# Patient Record
Sex: Female | Born: 1980 | Race: Black or African American | Hispanic: No | Marital: Single | Smoking: Current every day smoker
Health system: Southern US, Community
[De-identification: ages and names within clinical notes are randomized; demographics above are authoritative.]

## PROBLEM LIST (undated history)

## (undated) DIAGNOSIS — J45909 Unspecified asthma, uncomplicated: Secondary | ICD-10-CM

---

## 2015-06-20 ENCOUNTER — Emergency Department (HOSPITAL_COMMUNITY): Payer: Self-pay

## 2015-06-20 ENCOUNTER — Emergency Department (HOSPITAL_COMMUNITY)
Admission: EM | Admit: 2015-06-20 | Discharge: 2015-06-20 | Disposition: A | Discharge status: Home / Self Care | Payer: Self-pay | Attending: Emergency Medicine | Admitting: Emergency Medicine

## 2015-06-20 ENCOUNTER — Encounter (HOSPITAL_COMMUNITY): Payer: Self-pay | Admitting: *Deleted

## 2015-06-20 DIAGNOSIS — Z880 Allergy status to penicillin: Secondary | ICD-10-CM | POA: Insufficient documentation

## 2015-06-20 DIAGNOSIS — Z792 Long term (current) use of antibiotics: Secondary | ICD-10-CM | POA: Insufficient documentation

## 2015-06-20 DIAGNOSIS — Z720 Tobacco use: Secondary | ICD-10-CM | POA: Insufficient documentation

## 2015-06-20 DIAGNOSIS — J069 Acute upper respiratory infection, unspecified: Secondary | ICD-10-CM | POA: Insufficient documentation

## 2015-06-20 MED ORDER — ALBUTEROL SULFATE (2.5 MG/3ML) 0.083% IN NEBU
5.0000 mg | INHALATION_SOLUTION | Freq: Once | RESPIRATORY_TRACT | Status: AC
  Administered 2015-06-20: 5 mg via RESPIRATORY_TRACT
  Filled 2015-06-20: qty 6

## 2015-06-20 MED ORDER — IPRATROPIUM BROMIDE 0.02 % IN SOLN
0.5000 mg | Freq: Once | RESPIRATORY_TRACT | Status: AC
  Administered 2015-06-20: 0.5 mg via RESPIRATORY_TRACT
  Filled 2015-06-20: qty 2.5

## 2015-06-20 MED ORDER — AZITHROMYCIN 250 MG PO TABS: 250.0000 mg | ORAL_TABLET | Freq: Every day | ORAL | Status: DC

## 2015-06-20 MED ORDER — AZITHROMYCIN 250 MG PO TABS
500.0000 mg | ORAL_TABLET | Freq: Once | ORAL | Status: AC
  Administered 2015-06-20: 500 mg via ORAL
  Filled 2015-06-20: qty 2

## 2015-06-20 MED ORDER — ALBUTEROL SULFATE HFA 108 (90 BASE) MCG/ACT IN AERS
2.0000 | INHALATION_SPRAY | RESPIRATORY_TRACT | Status: DC | PRN
  Administered 2015-06-20: 2 via RESPIRATORY_TRACT
  Filled 2015-06-20: qty 6.7

## 2015-06-20 MED ORDER — GUAIFENESIN 100 MG/5ML PO SOLN
5.0000 mL | Freq: Once | ORAL | Status: AC
  Administered 2015-06-20: 100 mg via ORAL
  Filled 2015-06-20: qty 5

## 2015-06-20 MED ORDER — PREDNISONE 20 MG PO TABS
60.0000 mg | ORAL_TABLET | Freq: Once | ORAL | Status: AC
  Administered 2015-06-20: 60 mg via ORAL
  Filled 2015-06-20: qty 3

## 2015-06-20 MED ORDER — GUAIFENESIN 100 MG/5ML PO LIQD: 100.0000 mg | ORAL | Status: DC | PRN

## 2015-06-20 NOTE — ED Notes (Addendum)
Pt reports cough, congestion, sore throat and SOB starting last night and worsening today. Pt reports yellow mucous with cough.

## 2015-06-20 NOTE — Discharge Instructions (Signed)
Cool Mist Vaporizers °Vaporizers may help relieve the symptoms of a cough and cold. They add moisture to the air, which helps mucus to become thinner and less sticky. This makes it easier to breathe and cough up secretions. Cool mist vaporizers do not cause serious burns like hot mist vaporizers, which may also be called steamers or humidifiers. Vaporizers have not been proven to help with colds. You should not use a vaporizer if you are allergic to mold. °HOME CARE INSTRUCTIONS °· Follow the package instructions for the vaporizer. °· Do not use anything other than distilled water in the vaporizer. °· Do not run the vaporizer all of the time. This can cause mold or bacteria to grow in the vaporizer. °· Clean the vaporizer after each time it is used. °· Clean and dry the vaporizer well before storing it. °· Stop using the vaporizer if worsening respiratory symptoms develop. °Document Released: 06/26/2004 Document Revised: 10/04/2013 Document Reviewed: 02/16/2013 °ExitCare® Patient Information ©2015 [employer]. This information is not intended to replace advice given to you by your health care provider. Make sure you discuss any questions you have with your health care provider. ° °Upper Respiratory Infection, Adult °An upper respiratory infection (URI) is also sometimes known as the common cold. The upper respiratory tract includes the nose, sinuses, throat, trachea, and bronchi. Bronchi are the airways leading to the lungs. Most people improve within 1 week, but symptoms can last up to 2 weeks. A residual cough may last even longer.  °CAUSES °Many different viruses can infect the tissues lining the upper respiratory tract. The tissues become irritated and inflamed and often become very moist. Mucus production is also common. A cold is contagious. You can easily spread the virus to others by oral contact. This includes kissing, sharing a glass, coughing, or sneezing. Touching your mouth or nose and then touching a  surface, which is then touched by another person, can also spread the virus. °SYMPTOMS  °Symptoms typically develop 1 to 3 days after you come in contact with a cold virus. Symptoms vary from person to person. They may include: °· Runny nose. °· Sneezing. °· Nasal congestion. °· Sinus irritation. °· Sore throat. °· Loss of voice (laryngitis). °· Cough. °· Fatigue. °· Muscle aches. °· Loss of appetite. °· Headache. °· Low-grade fever. °DIAGNOSIS  °You might diagnose your own cold based on familiar symptoms, since most people get a cold 2 to 3 times a year. Your caregiver can confirm this based on your exam. Most importantly, your caregiver can check that your symptoms are not due to another disease such as strep throat, sinusitis, pneumonia, asthma, or epiglottitis. Blood tests, throat tests, and X-rays are not necessary to diagnose a common cold, but they may sometimes be helpful in excluding other more serious diseases. Your caregiver will decide if any further tests are required. °RISKS AND COMPLICATIONS  °You may be at risk for a more severe case of the common cold if you smoke cigarettes, have chronic heart disease (such as heart failure) or lung disease (such as asthma), or if you have a weakened immune system. The very young and very old are also at risk for more serious infections. Bacterial sinusitis, middle ear infections, and bacterial pneumonia can complicate the common cold. The common cold can worsen asthma and chronic obstructive pulmonary disease (COPD). Sometimes, these complications can require emergency medical care and may be life-threatening. °PREVENTION  °The best way to protect against getting a cold is to practice good hygiene.   Avoid oral or hand contact with people with cold symptoms. Wash your hands often if contact occurs. There is no clear evidence that vitamin C, vitamin E, echinacea, or exercise reduces the chance of developing a cold. However, it is always recommended to get plenty of  rest and practice good nutrition. °TREATMENT  °Treatment is directed at relieving symptoms. There is no cure. Antibiotics are not effective, because the infection is caused by a virus, not by bacteria. Treatment may include: °· Increased fluid intake. Sports drinks offer valuable electrolytes, sugars, and fluids. °· Breathing heated mist or steam (vaporizer or shower). °· Eating chicken soup or other clear broths, and maintaining good nutrition. °· Getting plenty of rest. °· Using gargles or lozenges for comfort. °· Controlling fevers with ibuprofen or acetaminophen as directed by your caregiver. °· Increasing usage of your inhaler if you have asthma. °Zinc gel and zinc lozenges, taken in the first 24 hours of the common cold, can shorten the duration and lessen the severity of symptoms. Pain medicines may help with fever, muscle aches, and throat pain. A variety of non-prescription medicines are available to treat congestion and runny nose. Your caregiver can make recommendations and may suggest nasal or lung inhalers for other symptoms.  °HOME CARE INSTRUCTIONS  °· Only take over-the-counter or prescription medicines for pain, discomfort, or fever as directed by your caregiver. °· Use a warm mist humidifier or inhale steam from a shower to increase air moisture. This may keep secretions moist and make it easier to breathe. °· Drink enough water and fluids to keep your urine clear or pale yellow. °· Rest as needed. °· Return to work when your temperature has returned to normal or as your caregiver advises. You may need to stay home longer to avoid infecting others. You can also use a face mask and careful hand washing to prevent spread of the virus. °SEEK MEDICAL CARE IF:  °· After the first few days, you feel you are getting worse rather than better. °· You need your caregiver's advice about medicines to control symptoms. °· You develop chills, worsening shortness of breath, or brown or red sputum. These may be  signs of pneumonia. °· You develop yellow or brown nasal discharge or pain in the face, especially when you bend forward. These may be signs of sinusitis. °· You develop a fever, swollen neck glands, pain with swallowing, or white areas in the back of your throat. These may be signs of strep throat. °SEEK IMMEDIATE MEDICAL CARE IF:  °· You have a fever. °· You develop severe or persistent headache, ear pain, sinus pain, or chest pain. °· You develop wheezing, a prolonged cough, cough up blood, or have a change in your usual mucus (if you have chronic lung disease). °· You develop sore muscles or a stiff neck. °Document Released: 03/25/2001 Document Revised: 12/22/2011 Document Reviewed: 01/04/2014 °ExitCare® Patient Information ©2015 [employer]. This information is not intended to replace advice given to you by your health care provider. Make sure you discuss any questions you have with your health care provider. ° °

## 2015-06-20 NOTE — ED Provider Notes (Signed)
CSN: [id number]     Arrival date & time 06/20/15  3016 History  This chart was scribed for Marlon Pel, PA-C, working with Marily Memos, MD by Elon Spanner, ED Scribe. This patient was seen in room TR06C/TR06C and the patient's care was started at 8:00 PM.   Chief Complaint  Patient presents with  . Cough  . Sore Throat   Patient is a 34 y.o. female presenting with pharyngitis. The history is provided by the patient. No language interpreter was used.  Sore Throat   HPI Comments: Mikayla Livingston is a 34 y.o. female, current everyday smoker, with no significant medical hx who presents to the Emergency Department complaining of a moderate sore throat onset 5 days, improved with cough drops.   Associated symptoms include exertional SOB (worse in the morning), cough, congestion, chest tightness, bilateral otalgia, and v/d (one episode each).  She has also tried Theraflu, Mucinex, cough drops, Vitamin C and Albuterol.  She denies abdominal pain, dysuria.    No fevers, headache, neck pain, arthralgias, myalgias, confusion, weakness or rash  History reviewed. No pertinent past medical history.   History reviewed. No pertinent past surgical history. No family history on file. Social History  Substance Use Topics  . Smoking status: Current Every Day Smoker -- 0.00 packs/day  . Smokeless tobacco: None  . Alcohol Use: None   OB History    No data available     Review of Systems A complete 10 system review of systems was obtained and all systems are negative except as noted in the HPI and PMH.   Allergies  Penicillins  Home Medications   Prior to Admission medications   Medication Sig Start Date End Date Taking? Authorizing Provider  azithromycin (ZITHROMAX) 250 MG tablet Take 1 tablet (250 mg total) by mouth daily. 1 tab daily til finished. 06/21/15   Muriah Harsha Neva Seat, PA-C  guaiFENesin (ROBITUSSIN) 100 MG/5ML liquid Take 5-10 mLs (100-200 mg total) by mouth every 4 (four) hours as needed for  cough. 06/20/15   Teng Decou Neva Seat, PA-C   BP 115/71 mmHg  Pulse 70  Temp(Src) 98.6 F (37 C) (Oral)  Resp 22  Ht 5\' 5"  (1.651 m)  Wt 115 lb (52.164 kg)  BMI 19.14 kg/m2  SpO2 98%  LMP 06/05/2015 Physical Exam  Constitutional: She is oriented to person, place, and time. She appears well-developed and well-nourished. No distress.  HENT:  Head: Normocephalic and atraumatic.  Right Ear: Tympanic membrane and ear canal normal.  Left Ear: Tympanic membrane and ear canal normal.  Nose: Nose normal. Right sinus exhibits no maxillary sinus tenderness and no frontal sinus tenderness. Left sinus exhibits no maxillary sinus tenderness and no frontal sinus tenderness.  Mouth/Throat: Uvula is midline and oropharynx is clear and moist.  Eyes: Conjunctivae and EOM are normal.  Neck: Neck supple. No tracheal deviation present.  Cardiovascular: Normal rate.   Pulmonary/Chest: Effort normal. No respiratory distress. She has wheezes. She has rhonchi in the left upper field.  Abdominal: Bowel sounds are normal. There is no tenderness. There is no rigidity, no rebound and no guarding.  Musculoskeletal: Normal range of motion.  Neurological: She is alert and oriented to person, place, and time.  Skin: Skin is warm and dry. No rash noted.  Psychiatric: She has a normal mood and affect. Her behavior is normal.  Nursing note and vitals reviewed.   ED Course  Procedures (including critical care time)  DIAGNOSTIC STUDIES: Oxygen Saturation is 98% on RA, normal by my  interpretation.    COORDINATION OF CARE:  8:07 PM Will order breathing treatment and prescribe cough medication and antibiotic.  Patient acknowledges and agrees with plan.    Labs Review Labs Reviewed - No data to display  Imaging Review Dg Chest 2 View  06/20/2015   CLINICAL DATA:  Cough and shortness of breath for 3 days.  EXAM: CHEST  2 VIEW  COMPARISON:  None.  FINDINGS: Normal cardiac and mediastinal contours. No consolidative  pulmonary opacities. No pleural effusion or pneumothorax. Scoliotic curvature of the thoracolumbar spine.  IMPRESSION: No acute cardiopulmonary process.   Electronically Signed   By: Annia Belt M.D.   On: 06/20/2015 19:53   I have personally reviewed and evaluated these images and lab results as part of my medical decision-making.   EKG Interpretation None      MDM   Final diagnoses:  URI (upper respiratory infection)    Medications  guaiFENesin (ROBITUSSIN) 100 MG/5ML solution 100 mg (not administered)  albuterol (PROVENTIL HFA;VENTOLIN HFA) 108 (90 BASE) MCG/ACT inhaler 2 puff (not administered)  albuterol (PROVENTIL) (2.5 MG/3ML) 0.083% nebulizer solution 5 mg (5 mg Nebulization Given 06/20/15 2016)  ipratropium (ATROVENT) nebulizer solution 0.5 mg (0.5 mg Nebulization Given 06/20/15 2016)  azithromycin (ZITHROMAX) tablet 500 mg (500 mg Oral Given 06/20/15 2015)  predniSONE (DELTASONE) tablet 60 mg (60 mg Oral Given 06/20/15 2015)   Patient has wheezing with rhonchi. Her chest xray is negative for pneumonia or any acute cardiopulmonary disease. Oxygen saturation is 98 % and pt speaks in full sentences. Afebrile non toxic appearing.  RX:  azithromycin (ZITHROMAX) 250 MG tablet Take 1 tablet (250 mg total) by mouth daily. 1 tab daily til finished. 4 tablet Marlon Pel, PA-C    guaiFENesin (ROBITUSSIN) 100 MG/5ML liquid Take 5-10 mLs (100-200 mg total) by mouth every 4 (four) hours as needed for cough. 60 mL Marlon Pel, PA-C    34 y.o.Mikayla Livingston's evaluation in the Emergency Department is complete. It has been determined that no acute conditions requiring further emergency intervention are present at this time. The patient/guardian have been advised of the diagnosis and plan. We have discussed signs and symptoms that warrant return to the ED, such as changes or worsening in symptoms.  Vital signs are stable at discharge. Filed Vitals:   06/20/15 1901  BP: 115/71  Pulse: 70   Temp: 98.6 F (37 C)  Resp: 22    Patient/guardian has voiced understanding and agreed to follow-up with the PCP or specialist.   I personally performed the services described in this documentation, which was scribed in my presence. The recorded information has been reviewed and is accurate.    Marlon Pel, PA-C 06/20/15 2019  Marily Memos, MD 06/21/15 [phone]

## 2015-06-22 ENCOUNTER — Other Ambulatory Visit: Payer: Self-pay

## 2015-11-05 ENCOUNTER — Emergency Department (HOSPITAL_COMMUNITY)
Admission: EM | Admit: 2015-11-05 | Discharge: 2015-11-05 | Disposition: A | Discharge status: Home / Self Care | Payer: Self-pay | Attending: Emergency Medicine | Admitting: Emergency Medicine

## 2015-11-05 ENCOUNTER — Emergency Department (HOSPITAL_COMMUNITY): Payer: Self-pay

## 2015-11-05 ENCOUNTER — Encounter (HOSPITAL_COMMUNITY): Payer: Self-pay

## 2015-11-05 DIAGNOSIS — F17200 Nicotine dependence, unspecified, uncomplicated: Secondary | ICD-10-CM | POA: Insufficient documentation

## 2015-11-05 DIAGNOSIS — R079 Chest pain, unspecified: Secondary | ICD-10-CM | POA: Insufficient documentation

## 2015-11-05 DIAGNOSIS — J069 Acute upper respiratory infection, unspecified: Secondary | ICD-10-CM | POA: Insufficient documentation

## 2015-11-05 DIAGNOSIS — J40 Bronchitis, not specified as acute or chronic: Secondary | ICD-10-CM | POA: Insufficient documentation

## 2015-11-05 DIAGNOSIS — Z880 Allergy status to penicillin: Secondary | ICD-10-CM | POA: Insufficient documentation

## 2015-11-05 MED ORDER — ALBUTEROL SULFATE HFA 108 (90 BASE) MCG/ACT IN AERS
2.0000 | INHALATION_SPRAY | Freq: Once | RESPIRATORY_TRACT | Status: AC
  Administered 2015-11-05: 2 via RESPIRATORY_TRACT
  Filled 2015-11-05: qty 6.7

## 2015-11-05 MED ORDER — PREDNISONE 20 MG PO TABS: 40.0000 mg | ORAL_TABLET | Freq: Every day | ORAL | Status: DC

## 2015-11-05 MED ORDER — PREDNISONE 20 MG PO TABS
60.0000 mg | ORAL_TABLET | Freq: Once | ORAL | Status: AC
  Administered 2015-11-05: 60 mg via ORAL
  Filled 2015-11-05: qty 3

## 2015-11-05 MED ORDER — GUAIFENESIN-DM 100-10 MG/5ML PO SYRP: 5.0000 mL | ORAL_SOLUTION | ORAL | Status: DC | PRN

## 2015-11-05 MED ORDER — IPRATROPIUM-ALBUTEROL 0.5-2.5 (3) MG/3ML IN SOLN
3.0000 mL | Freq: Once | RESPIRATORY_TRACT | Status: AC
  Administered 2015-11-05: 3 mL via RESPIRATORY_TRACT
  Filled 2015-11-05: qty 3

## 2015-11-05 MED ORDER — AEROCHAMBER Z-STAT PLUS/MEDIUM MISC
1.0000 | Freq: Once | Status: AC
  Administered 2015-11-05: 1
  Filled 2015-11-05 (×2): qty 1

## 2015-11-05 NOTE — ED Notes (Signed)
Patient transported to X-ray 

## 2015-11-05 NOTE — ED Provider Notes (Signed)
CSN: [id number]     Arrival date & time 11/05/15  0906 History   First MD Initiated Contact with Patient 11/05/15 1008     Chief Complaint  Patient presents with  . Cough  . Shortness of Breath     (Consider location/radiation/quality/duration/timing/severity/associated sxs/prior Treatment) HPI Mikayla Livingston is a 35 y.o. female with no medical problems presents to emergency department complaining of cough and flulike symptoms. Patient states that her symptoms began 4 days ago. States chest pain began 3 days ago. Pain is worse with coughing and deep breathing. She reports associated shortness of breath. She states cough is productive, unknown color of the sputum. She reports associated body aches, chills, sweats, nasal congestion, sore throat. She denies any nausea, vomiting, diarrhea. No neck pain or stiffness. Denies urinary symptoms. She has not taken anything for her symptoms at home because she did not know what to take. No other complaints. Did not get her flu shot this year.  History reviewed. No pertinent past medical history. History reviewed. No pertinent past surgical history. No family history on file. Social History  Substance Use Topics  . Smoking status: Current Every Day Smoker -- 0.00 packs/day  . Smokeless tobacco: None  . Alcohol Use: None   OB History    No data available     Review of Systems  Constitutional: Positive for chills and fatigue. Negative for fever.  Respiratory: Positive for cough, chest tightness and shortness of breath.   Cardiovascular: Positive for chest pain. Negative for palpitations and leg swelling.  Gastrointestinal: Negative for nausea, vomiting, abdominal pain and diarrhea.  Genitourinary: Negative for dysuria, flank pain and pelvic pain.  Musculoskeletal: Negative for myalgias, arthralgias, neck pain and neck stiffness.  Skin: Negative for rash.  Neurological: Positive for weakness. Negative for dizziness and headaches.  All other systems  reviewed and are negative.     Allergies  Penicillins  Home Medications   Prior to Admission medications   Medication Sig Start Date End Date Taking? Authorizing Provider  azithromycin (ZITHROMAX) 250 MG tablet Take 1 tablet (250 mg total) by mouth daily. 1 tab daily til finished. Patient not taking: Reported on 11/05/2015 06/21/15   Marlon Pel, PA-C  guaiFENesin (ROBITUSSIN) 100 MG/5ML liquid Take 5-10 mLs (100-200 mg total) by mouth every 4 (four) hours as needed for cough. Patient not taking: Reported on 11/05/2015 06/20/15   Marlon Pel, PA-C   BP 114/83 mmHg  Pulse 67  Temp(Src) 98 F (36.7 C) (Oral)  Resp 25  Ht  (1.651 m)  Wt 50.576 kg  BMI 18.55 kg/m2  SpO2 95%  LMP 10/22/2015 Physical Exam  Constitutional: She is oriented to person, place, and time. She appears well-developed and well-nourished. No distress.  HENT:  Head: Normocephalic and atraumatic.  Right Ear: Tympanic membrane, external ear and ear canal normal.  Left Ear: Tympanic membrane, external ear and ear canal normal.  Nose: Mucosal edema and rhinorrhea present.  Mouth/Throat: Uvula is midline, oropharynx is clear and moist and mucous membranes are normal.  Eyes: Conjunctivae are normal. Pupils are equal, round, and reactive to light.  Neck: Neck supple.  Cardiovascular: Normal rate, regular rhythm and normal heart sounds.   Pulmonary/Chest: Effort normal. No respiratory distress. She has wheezes. She has no rales.  Inspiratory and expiratory wheezes bilaterally  Abdominal: Soft. Bowel sounds are normal. She exhibits no distension. There is no tenderness. There is no rebound.  Musculoskeletal: She exhibits no edema.  Neurological: She is alert and oriented  to person, place, and time. No cranial nerve deficit. Coordination normal.  Skin: Skin is warm and dry.  Psychiatric: She has a normal mood and affect. Her behavior is normal.  Nursing note and vitals reviewed.   ED Course  Procedures  (including critical care time) Labs Review Labs Reviewed - No data to display  Imaging Review Dg Chest 2 View  11/05/2015  CLINICAL DATA:  Right chest pain, shortness of breath for few days. Smoker. EXAM: CHEST  2 VIEW COMPARISON:  06/20/2015 FINDINGS: The heart size and mediastinal contours are within normal limits. Both lungs are clear. The visualized skeletal structures are unremarkable. IMPRESSION: No active cardiopulmonary disease. Electronically Signed   By: Charlett Nose M.D.   On: 11/05/2015 09:35   I have personally reviewed and evaluated these images and lab results as part of my medical decision-making.   EKG Interpretation None      MDM   Final diagnoses:  Bronchitis  URI (upper respiratory infection)    patient with URI symptoms. Here normal vital signs including normal temperature, blood pressure, heart rate. Normal oxygen saturation. Lungs are tight, with decreased air movement bilaterally, inspiratory and expiratory wheezes. Chest x-ray obtained and is negative. Will try breathing treatments started prednisone.   11:23 AM Pt feeling much better after a breathing treatment. Will dc home with inhaler, prednisone course, follow up with pcp.  Will also give prescription for prednisone burst. Pt's vS normal, not hypoxic, not tachycardic, doubt PE. No respiratory distress.   Filed Vitals:   11/05/15 1045 11/05/15 1100 11/05/15 1115 11/05/15 1130  BP: 115/81 124/75 109/74 107/74  Pulse: 74 70 68 72  Temp:      TempSrc:      Resp: Height:      Weight:      SpO2: 97% 96% 98% 96%     Jaynie Crumble, PA-C 11/05/15 2001  Margarita Grizzle, MD 11/07/15 1256

## 2015-11-05 NOTE — ED Notes (Signed)
Patient here with cough and congestion since Thursday, reports that she feels as if she cant catch her breath because it hurts to breath, no distress

## 2015-11-05 NOTE — Discharge Instructions (Signed)
Take prednisone as prescribed until all gone. Inhaler 2 puffs every 4 hrs. Robitussin as needed for cough. Follow up with primary care doctor.   Bronchospasm, Adult A bronchospasm is when the tubes that carry air in and out of your lungs (airways) spasm or tighten. During a bronchospasm it is hard to breathe. This is because the airways get smaller. A bronchospasm can be triggered by:  Allergies. These may be to animals, pollen, food, or mold.  Infection. This is a common cause of bronchospasm.  Exercise.  Irritants. These include pollution, cigarette smoke, strong odors, aerosol sprays, and paint fumes.  Weather changes.  Stress.  Being emotional. HOME CARE   Always have a plan for getting help. Know when to call your doctor and local emergency services (911 in the [place] Know where you can get emergency care.  Only take medicines as told by your doctor.  If you were prescribed an inhaler or nebulizer machine, ask your doctor how to use it correctly. Always use a spacer with your inhaler if you were given one.  Stay calm during an attack. Try to relax and breathe more slowly.  Control your home environment:  Change your heating and air conditioning filter at least once a month.  Limit your use of fireplaces and wood stoves.  Do not  smoke. Do not  allow smoking in your home.  Avoid perfumes and fragrances.  Get rid of pests (such as roaches and mice) and their droppings.  Throw away plants if you see mold on them.  Keep your house clean and dust free.  Replace carpet with wood, tile, or vinyl flooring. Carpet can trap dander and dust.  Use allergy-proof pillows, mattress covers, and box spring covers.  Wash bed sheets and blankets every week in hot water. Dry them in a dryer.  Use blankets that are made of polyester or cotton.  Wash hands frequently. GET HELP IF:  You have muscle aches.  You have chest pain.  The thick spit you spit or cough up (sputum)  changes from clear or white to yellow, green, gray, or bloody.  The thick spit you spit or cough up gets thicker.  There are problems that may be related to the medicine you are given such as:  A rash.  Itching.  Swelling.  Trouble breathing. GET HELP RIGHT AWAY IF:  You feel you cannot breathe or catch your breath.  You cannot stop coughing.  Your treatment is not helping you breathe better.  You have very bad chest pain. MAKE SURE YOU:   Understand these instructions.  Will watch your condition.  Will get help right away if you are not doing well or get worse.   This information is not intended to replace advice given to you by your health care provider. Make sure you discuss any questions you have with your health care provider.   Document Released: 07/27/2009 Document Revised: 10/20/2014 Document Reviewed: 03/22/2013 Elsevier Interactive Patient Education 2016 [employer]  Upper Respiratory Infection, Adult Most upper respiratory infections (URIs) are a viral infection of the air passages leading to the lungs. A URI affects the nose, throat, and upper air passages. The most common type of URI is nasopharyngitis and is typically referred to as "the common cold." URIs run their course and usually go away on their own. Most of the time, a URI does not require medical attention, but sometimes a bacterial infection in the upper airways can follow a viral infection. This is called  a secondary infection. Sinus and middle ear infections are common types of secondary upper respiratory infections. Bacterial pneumonia can also complicate a URI. A URI can worsen asthma and chronic obstructive pulmonary disease (COPD). Sometimes, these complications can require emergency medical care and may be life threatening.  CAUSES Almost all URIs are caused by viruses. A virus is a type of germ and can spread from one person to another.  RISKS FACTORS You may be at risk for a URI if:   You  smoke.   You have chronic heart or lung disease.  You have a weakened defense (immune) system.   You are very young or very old.   You have nasal allergies or asthma.  You work in crowded or poorly ventilated areas.  You work in health care facilities or schools. SIGNS AND SYMPTOMS  Symptoms typically develop 2-3 days after you come in contact with a cold virus. Most viral URIs last 7-10 days. However, viral URIs from the influenza virus (flu virus) can last 14-18 days and are typically more severe. Symptoms may include:   Runny or stuffy (congested) nose.   Sneezing.   Cough.   Sore throat.   Headache.   Fatigue.   Fever.   Loss of appetite.   Pain in your forehead, behind your eyes, and over your cheekbones (sinus pain).  Muscle aches.  DIAGNOSIS  Your health care provider may diagnose a URI by:  Physical exam.  Tests to check that your symptoms are not due to another condition such as:  Strep throat.  Sinusitis.  Pneumonia.  Asthma. TREATMENT  A URI goes away on its own with time. It cannot be cured with medicines, but medicines may be prescribed or recommended to relieve symptoms. Medicines may help:  Reduce your fever.  Reduce your cough.  Relieve nasal congestion. HOME CARE INSTRUCTIONS   Take medicines only as directed by your health care provider.   Gargle warm saltwater or take cough drops to comfort your throat as directed by your health care provider.  Use a warm mist humidifier or inhale steam from a shower to increase air moisture. This may make it easier to breathe.  Drink enough fluid to keep your urine clear or pale yellow.   Eat soups and other clear broths and maintain good nutrition.   Rest as needed.   Return to work when your temperature has returned to normal or as your health care provider advises. You may need to stay home longer to avoid infecting others. You can also use a face mask and careful hand  washing to prevent spread of the virus.  Increase the usage of your inhaler if you have asthma.   Do not use any tobacco products, including cigarettes, chewing tobacco, or electronic cigarettes. If you need help quitting, ask your health care provider. PREVENTION  The best way to protect yourself from getting a cold is to practice good hygiene.   Avoid oral or hand contact with people with cold symptoms.   Wash your hands often if contact occurs.  There is no clear evidence that vitamin C, vitamin E, echinacea, or exercise reduces the chance of developing a cold. However, it is always recommended to get plenty of rest, exercise, and practice good nutrition.  SEEK MEDICAL CARE IF:   You are getting worse rather than better.   Your symptoms are not controlled by medicine.   You have chills.  You have worsening shortness of breath.  You have brown or  red mucus.  You have yellow or brown nasal discharge.  You have pain in your face, especially when you bend forward.  You have a fever.  You have swollen neck glands.  You have pain while swallowing.  You have white areas in the back of your throat. SEEK IMMEDIATE MEDICAL CARE IF:   You have severe or persistent:  Headache.  Ear pain.  Sinus pain.  Chest pain.  You have chronic lung disease and any of the following:  Wheezing.  Prolonged cough.  Coughing up blood.  A change in your usual mucus.  You have a stiff neck.  You have changes in your:  Vision.  Hearing.  Thinking.  Mood. MAKE SURE YOU:   Understand these instructions.  Will watch your condition.  Will get help right away if you are not doing well or get worse.   This information is not intended to replace advice given to you by your health care provider. Make sure you discuss any questions you have with your health care provider.   Document Released: 03/25/2001 Document Revised: 02/13/2015 Document Reviewed: 01/04/2014 Elsevier  Interactive Patient Education [employer]

## 2016-07-20 ENCOUNTER — Emergency Department (HOSPITAL_COMMUNITY): Payer: Medicaid Other

## 2016-07-20 ENCOUNTER — Encounter (HOSPITAL_COMMUNITY): Payer: Self-pay

## 2016-07-20 ENCOUNTER — Emergency Department (HOSPITAL_COMMUNITY)
Admission: EM | Admit: 2016-07-20 | Discharge: 2016-07-20 | Disposition: A | Discharge status: Home / Self Care | Payer: Medicaid Other | Attending: Emergency Medicine | Admitting: Emergency Medicine

## 2016-07-20 DIAGNOSIS — R0602 Shortness of breath: Secondary | ICD-10-CM | POA: Diagnosis present

## 2016-07-20 DIAGNOSIS — F17200 Nicotine dependence, unspecified, uncomplicated: Secondary | ICD-10-CM | POA: Insufficient documentation

## 2016-07-20 DIAGNOSIS — J9801 Acute bronchospasm: Secondary | ICD-10-CM | POA: Insufficient documentation

## 2016-07-20 HISTORY — DX: Unspecified asthma, uncomplicated: J45.909

## 2016-07-20 MED ORDER — ALBUTEROL SULFATE HFA 108 (90 BASE) MCG/ACT IN AERS: 1.0000 | INHALATION_SPRAY | Freq: Four times a day (QID) | RESPIRATORY_TRACT | 0 refills | Status: DC | PRN

## 2016-07-20 MED ORDER — ALBUTEROL SULFATE (2.5 MG/3ML) 0.083% IN NEBU
5.0000 mg | INHALATION_SOLUTION | Freq: Once | RESPIRATORY_TRACT | Status: AC
  Administered 2016-07-20: 5 mg via RESPIRATORY_TRACT
  Filled 2016-07-20: qty 6

## 2016-07-20 NOTE — ED Notes (Signed)
Patient transported to X-ray 

## 2016-07-20 NOTE — ED Provider Notes (Signed)
[facility]EMERGENCY DEPT Provider Note   CSN: [id number] Arrival date & time: 07/20/16  0435     History   Chief Complaint Chief Complaint  Patient presents with  . Shortness of Breath    HPI Mikayla Livingston is a 35 y.o. female.    reports a history of asthma comes in for evaluation of acute shortness of breath. Patient reports she woke up at 3:00 AM with spontaneous shortness of breath. She feels like this is typical bronchospasm for her. Reports symptoms resolved upon arrival in ED. Reports she did not have an albuterol inhaler at home. Denies any overt chest pain, fevers or chills, leg swelling, recent travel or surgeries, OCP or hormone use. Denies any discomfort now whatsoever. Would like referral to primary care. No other modifying factors  Past Medical History:  Diagnosis Date  . Asthma     There are no active problems to display for this patient.   History reviewed. No pertinent surgical history.  OB History    No data available       Home Medications    Prior to Admission medications   Medication Sig Start Date End Date Taking? Authorizing Provider  albuterol (PROVENTIL HFA;VENTOLIN HFA) 108 (90 Base) MCG/ACT inhaler Inhale 1-2 puffs into the lungs every 6 (six) hours as needed for wheezing or shortness of breath. 07/20/16   Joycie Peek, PA-C  azithromycin (ZITHROMAX) 250 MG tablet Take 1 tablet (250 mg total) by mouth daily. 1 tab daily til finished. Patient not taking: Reported on 07/20/2016 06/21/15   Marlon Pel, PA-C  guaiFENesin (ROBITUSSIN) 100 MG/5ML liquid Take 5-10 mLs (100-200 mg total) by mouth every 4 (four) hours as needed for cough. Patient not taking: Reported on 07/20/2016 06/20/15   Marlon Pel, PA-C  guaiFENesin-dextromethorphan (ROBITUSSIN DM) 100-10 MG/5ML syrup Take 5 mLs by mouth every 4 (four) hours as needed for cough. Patient not taking: Reported on 07/20/2016 11/05/15   Tatyana Kirichenko, PA-C  predniSONE (DELTASONE) 20 MG tablet Take 2  tablets (40 mg total) by mouth daily. Patient not taking: Reported on 07/20/2016 11/05/15   Jaynie Crumble, PA-C    Family History No family history on file.  Social History Social History  Substance Use Topics  . Smoking status: Current Every Day Smoker    Packs/day: 0.00  . Smokeless tobacco: Not on file  . Alcohol use No     Allergies   Penicillins   Review of Systems Review of Systems A 10 point review of systems was completed and was negative except for pertinent positives and negatives as mentioned in the history of present illness    Physical Exam Updated Vital Signs BP 109/57   Pulse 79   Temp 97.9 F (36.6 C) (Oral)   Resp 19   Ht 5\' 5"  (1.651 m)   Wt 54.4 kg   LMP 07/07/2016 (Within Days)   SpO2 97%   BMI 19.97 kg/m   Physical Exam  Constitutional: She appears well-developed. No distress.  Awake, alert and nontoxic in appearance  HENT:  Head: Normocephalic and atraumatic.  Right Ear: External ear normal.  Left Ear: External ear normal.  Mouth/Throat: Oropharynx is clear and moist.  Eyes: Conjunctivae and EOM are normal. Pupils are equal, round, and reactive to light.  Neck: Normal range of motion. No JVD present.  Cardiovascular: Normal rate, regular rhythm and normal heart sounds.   Pulmonary/Chest: Effort normal and breath sounds normal. No stridor.  Abdominal: Soft. There is no tenderness.  Musculoskeletal: Normal range  of motion. She exhibits no edema.  Neurological:  Awake, alert, cooperative and aware of situation; motor strength bilaterally; sensation normal to light touch bilaterally; no facial asymmetry; tongue midline; major cranial nerves appear intact;  baseline gait without new ataxia.  Skin: No rash noted. She is not diaphoretic.  Psychiatric: She has a normal mood and affect. Her behavior is normal. Thought content normal.  Nursing note and vitals reviewed.     ED Treatments / Results  Labs (all labs ordered are listed, but  only abnormal results are displayed) Labs Reviewed - No data to display  EKG  EKG Interpretation  Date/Time:  Sunday July 20 2016 05:26:53 EDT Ventricular Rate:  63 PR Interval:    QRS Duration: 82 QT Interval:  446 QTC Calculation: 457 R Axis:   76 Text Interpretation:  Sinus rhythm Confirmed by [facility]AL[facility]RAS[facility]  MD, APRIL ([id number]) on 07/20/2016 5:37:02 AM       Radiology Dg Chest 2 View  Result Date: 07/20/2016 CLINICAL DATA:  Shortness of breath for 3 days.  Coughing EXAM: CHEST  2 VIEW COMPARISON:  Chest radiograph 11/05/2015 FINDINGS: Cardiomediastinal contours are normal. No pneumothorax or pleural effusion. No focal airspace consolidation or pulmonary edema. IMPRESSION: Clear lungs. Electronically Signed   By: Deatra RobinsonKevin  Herman M.D.   On: 07/20/2016 06:22    Procedures Procedures (including critical care time)  Medications Ordered in ED Medications  albuterol (PROVENTIL) (2.5 MG/3ML) 0.083% nebulizer solution 5 mg (5 mg Nebulization Given 07/20/16 0534)   <ECG>   Initial Impression / Assessment and Plan / ED Course  I have reviewed the triage vital signs and the nursing notes.  Pertinent labs & imaging results that were available during my care of the patient were reviewed by me and considered in my medical decision making (see chart for details).  Clinical Course    Patient with likely acute bronchospasm that resolved spontaneously. Rx for albuterol inhaler as well as referral to primary care. Low suspicion for other emergent pathology. She is PERC negative. Discussed follow-up as well as strict return precautions. She understands to return to emergency department if symptoms resume or worsen.  Final Clinical Impressions(s) / ED Diagnoses   Final diagnoses:  Bronchospasm    New Prescriptions New Prescriptions   ALBUTEROL (PROVENTIL HFA;VENTOLIN HFA) 108 (90 BASE) MCG/ACT INHALER    Inhale 1-2 puffs into the lungs every 6 (six) hours as needed for wheezing or  shortness of breath.     Joycie PeekBenjamin Desma Wilkowski, PA-C 07/20/16 1401    April Palumbo, MD 07/24/16 [phone]

## 2016-07-20 NOTE — Discharge Instructions (Signed)
Use your albuterol inhaler as we discussed. Follow up with [facility] for definitive primary care. Return to ED for any new or worsening symptoms as we discussed.

## 2016-07-20 NOTE — ED Triage Notes (Signed)
Pt states she started having coughing with yellow sputum x 3 days; pt states she woke up this morning with SOB; Pt c/o HA x 3 days but denies pain on at triage; Pt a&ox 4 on arrival. No acute distress noted;

## 2017-07-03 ENCOUNTER — Emergency Department (HOSPITAL_COMMUNITY)
Admission: EM | Admit: 2017-07-03 | Discharge: 2017-07-03 | Disposition: A | Discharge status: Home / Self Care | Payer: Self-pay | Attending: Emergency Medicine | Admitting: Emergency Medicine

## 2017-07-03 ENCOUNTER — Emergency Department (HOSPITAL_COMMUNITY): Payer: Self-pay

## 2017-07-03 ENCOUNTER — Encounter (HOSPITAL_COMMUNITY): Payer: Self-pay | Admitting: *Deleted

## 2017-07-03 DIAGNOSIS — J40 Bronchitis, not specified as acute or chronic: Secondary | ICD-10-CM | POA: Insufficient documentation

## 2017-07-03 DIAGNOSIS — F17200 Nicotine dependence, unspecified, uncomplicated: Secondary | ICD-10-CM | POA: Insufficient documentation

## 2017-07-03 DIAGNOSIS — R070 Pain in throat: Secondary | ICD-10-CM | POA: Insufficient documentation

## 2017-07-03 MED ORDER — ALBUTEROL SULFATE HFA 108 (90 BASE) MCG/ACT IN AERS: 1.0000 | INHALATION_SPRAY | Freq: Four times a day (QID) | RESPIRATORY_TRACT | 0 refills | Status: DC | PRN

## 2017-07-03 MED ORDER — AZITHROMYCIN 250 MG PO TABS: ORAL_TABLET | ORAL | 0 refills | Status: DC

## 2017-07-03 NOTE — ED Provider Notes (Signed)
[facility] Provider Note   CSN: [id number] Arrival date & time: 07/03/17  0856     History   Chief Complaint Chief Complaint  Patient presents with  . Nasal Congestion  . Sore Throat  . Dizziness    HPI Mikayla Livingston is a 36 y.o. female history of asthma here presenting with chest tightness, cough. 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 For the last 2-3 days, she has been having nonproductive cough, sore throat, left ear pain. Also felt dizzy yesterday but didn't pass out. Had subjective chills and fever 2 days that resolved. Denies abdominal pain or vomiting or urinary symptoms. Denies being pregnant.   The history is provided by the patient.    Past Medical History:  Diagnosis Date  . Asthma     There are no active problems to display for this patient.   History reviewed. No pertinent surgical history.  OB History    No data available       Home Medications    Prior to Admission medications   Medication Sig Start Date End Date Taking? Authorizing Provider  albuterol (PROVENTIL HFA;VENTOLIN HFA) 108 (90 Base) MCG/ACT inhaler Inhale 1-2 puffs into the lungs every 6 (six) hours as needed for wheezing or shortness of breath. 07/03/17   Charlynne Pander, MD  azithromycin (ZITHROMAX Z-PAK) 250 MG tablet 2 po day one, then 1 daily x 4 days 07/03/17   Charlynne Pander, MD  guaiFENesin (ROBITUSSIN) 100 MG/5ML liquid Take 5-10 mLs (100-200 mg total) by mouth every 4 (four) hours as needed for cough. Patient not taking: Reported on 07/20/2016 06/20/15   Marlon Pel, PA-C  guaiFENesin-dextromethorphan (ROBITUSSIN DM) 100-10 MG/5ML syrup Take 5 mLs by mouth every 4 (four) hours as needed for cough. Patient not taking: Reported on 07/20/2016 11/05/15   Jaynie Crumble, PA-C  predniSONE (DELTASONE) 20 MG tablet Take 2 tablets (40 mg total) by mouth daily. Patient not taking: Reported on  07/20/2016 11/05/15   Jaynie Crumble, PA-C    Family History No family history on file.  Social History Social History  Substance Use Topics  . Smoking status: Current Every Day Smoker    Packs/day: 0.00  . Smokeless tobacco: Never Used  . Alcohol use No     Allergies   Penicillins   Review of Systems Review of Systems  Constitutional: Positive for chills.  Respiratory: Positive for cough.   Neurological: Positive for dizziness.  All other systems reviewed and are negative.    Physical Exam Updated Vital Signs BP (!) 102/59 (BP Location: Right Arm)   Pulse 61   Temp 98.7 F (37.1 C) (Oral)   Resp (!) 22   Ht  (1.651 m)   LMP 06/19/2017 (Approximate)   SpO2 100%   Physical Exam  Constitutional: She is oriented to person, place, and time. She appears well-developed.  HENT:  Head: Normocephalic.  Right Ear: External ear normal.  Left Ear: External ear normal.  Mouth/Throat: Oropharynx is clear and moist.  TM nl bilaterally, posterior pharynx not red, no tonsillar exudates   Eyes: Pupils are equal, round, and reactive to light. Conjunctivae and EOM are normal.  Neck: Normal range of motion. Neck supple.  Cardiovascular: Normal rate, regular rhythm and normal heart sounds.   Pulmonary/Chest: Effort normal.  Minimal wheezing throughout, no retractions, slightly tachypneic   Abdominal: Soft. Bowel sounds are normal. She exhibits no distension. There  is no tenderness. There is no guarding.  Musculoskeletal: Normal range of motion. She exhibits no deformity (.id).  Neurological: She is alert and oriented to person, place, and time.  Skin: Skin is warm.  Psychiatric: She has a normal mood and affect.  Nursing note and vitals reviewed.    ED Treatments / Results  Labs (all labs ordered are listed, but only abnormal results are displayed) Labs Reviewed - No data to display  EKG  EKG Interpretation None       Radiology Dg Chest 2 View  Result  Date: 07/03/2017 CLINICAL DATA:  Headache and fever.  Chest pain. EXAM: CHEST  2 VIEW COMPARISON:  July 20, 2016 FINDINGS: The heart size and mediastinal contours are within normal limits. Both lungs are clear. The visualized skeletal structures are unremarkable. IMPRESSION: No active cardiopulmonary disease. Electronically Signed   By: Gerome Sam III M.D   On: 07/03/2017 09:59    Procedures Procedures (including critical care time)  Medications Ordered in ED Medications - No data to display   Initial Impression / Assessment and Plan / ED Course  I have reviewed the triage vital signs and the nursing notes.  Pertinent labs & imaging results that were available during my care of the patient were reviewed by me and considered in my medical decision making (see chart for details).    Mikayla Livingston is a 36 y.o. female here with cough, wheezing. Afebrile, not hypoxia. Well appearing. Likely mild bronchitis. CXR showed no pneumonia. Will dc home with zpack, albuterol prn.    Final Clinical Impressions(s) / ED Diagnoses   Final diagnoses:  Bronchitis    New Prescriptions New Prescriptions   AZITHROMYCIN (ZITHROMAX Z-PAK) 250 MG TABLET    2 po day one, then 1 daily x 4 days     Charlynne Pander, MD 07/03/17 1222

## 2017-07-03 NOTE — Discharge Instructions (Signed)
Take tylenol, motrin for fever or chills   Use albuterol every 4-6 hrs as needed for cough.   Take zpack as prescribed.   See your doctor  Return to ER if you have fever, trouble breathing, worse sore throat or ear pain

## 2017-07-03 NOTE — ED Triage Notes (Signed)
Pt reports chest tightness and cough

## 2017-07-03 NOTE — ED Triage Notes (Signed)
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 Pt states dizziness happened twice and last time last night.

## 2018-04-11 ENCOUNTER — Emergency Department (HOSPITAL_COMMUNITY)
Admission: EM | Admit: 2018-04-11 | Discharge: 2018-04-11 | Disposition: A | Discharge status: Home / Self Care | Payer: Self-pay | Attending: Emergency Medicine | Admitting: Emergency Medicine

## 2018-04-11 ENCOUNTER — Emergency Department (HOSPITAL_COMMUNITY): Payer: Self-pay

## 2018-04-11 DIAGNOSIS — F17210 Nicotine dependence, cigarettes, uncomplicated: Secondary | ICD-10-CM | POA: Insufficient documentation

## 2018-04-11 DIAGNOSIS — J45901 Unspecified asthma with (acute) exacerbation: Secondary | ICD-10-CM | POA: Insufficient documentation

## 2018-04-11 DIAGNOSIS — J40 Bronchitis, not specified as acute or chronic: Secondary | ICD-10-CM

## 2018-04-11 DIAGNOSIS — R42 Dizziness and giddiness: Secondary | ICD-10-CM | POA: Insufficient documentation

## 2018-04-11 LAB — BASIC METABOLIC PANEL
ANION GAP: 6 (ref 5–15)
BUN: 7 mg/dL (ref 6–20)
CALCIUM: 8.7 mg/dL — AB (ref 8.9–10.3)
CHLORIDE: 106 mmol/L (ref 98–111)
CO2: 25 mmol/L (ref 22–32)
Creatinine, Ser: 0.78 mg/dL (ref 0.44–1.00)
GFR calc non Af Amer: >60 mL/min (ref >60)
GLUCOSE: 91 mg/dL (ref 70–99)
Potassium: 3.9 mmol/L (ref 3.5–5.1)
Sodium: 137 mmol/L (ref 135–145)

## 2018-04-11 LAB — CBC
HEMATOCRIT: 36.9 % (ref 36.0–46.0)
HEMOGLOBIN: 11.5 g/dL — AB (ref 12.0–15.0)
MCH: 23.5 pg — ABNORMAL LOW (ref 26.0–34.0)
MCHC: 31.2 g/dL (ref 30.0–36.0)
MCV: 75.3 fL — AB (ref 78.0–100.0)
Platelets: 272 10*3/uL (ref 150–400)
RBC: 4.9 MIL/uL (ref 3.87–5.11)
RDW: 14.7 % (ref 11.5–15.5)
WBC: 6.8 10*3/uL (ref 4.0–10.5)

## 2018-04-11 LAB — I-STAT TROPONIN, ED: TROPONIN I, POC: 0.01 ng/mL (ref 0.00–0.08)

## 2018-04-11 LAB — I-STAT BETA HCG BLOOD, ED (MC, WL, AP ONLY): I-stat hCG, quantitative: <5 m[IU]/mL (ref <5)

## 2018-04-11 MED ORDER — ALBUTEROL SULFATE HFA 108 (90 BASE) MCG/ACT IN AERS
2.0000 | INHALATION_SPRAY | Freq: Once | RESPIRATORY_TRACT | Status: AC
  Administered 2018-04-11: 2 via RESPIRATORY_TRACT
  Filled 2018-04-11: qty 6.7

## 2018-04-11 MED ORDER — PREDNISONE 20 MG PO TABS
60.0000 mg | ORAL_TABLET | Freq: Once | ORAL | Status: AC
  Administered 2018-04-11: 60 mg via ORAL
  Filled 2018-04-11: qty 3

## 2018-04-11 MED ORDER — ALBUTEROL SULFATE (2.5 MG/3ML) 0.083% IN NEBU: 2.5000 mg | INHALATION_SOLUTION | Freq: Four times a day (QID) | RESPIRATORY_TRACT | 12 refills | Status: DC | PRN

## 2018-04-11 MED ORDER — AEROCHAMBER PLUS FLO-VU MEDIUM MISC
1.0000 | Freq: Once | Status: DC
  Filled 2018-04-11: qty 1

## 2018-04-11 MED ORDER — DOXYCYCLINE HYCLATE 100 MG PO TABS
100.0000 mg | ORAL_TABLET | Freq: Once | ORAL | Status: AC
  Administered 2018-04-11: 100 mg via ORAL
  Filled 2018-04-11: qty 1

## 2018-04-11 MED ORDER — PREDNISONE 20 MG PO TABS: 40.0000 mg | ORAL_TABLET | Freq: Every day | ORAL | 0 refills | Status: AC

## 2018-04-11 MED ORDER — AZITHROMYCIN 250 MG PO TABS: 250.0000 mg | ORAL_TABLET | Freq: Every day | ORAL | 0 refills | Status: DC

## 2018-04-11 NOTE — ED Triage Notes (Signed)
Pt states intermittent chest pain since Friday, 5/10 currently to entire chest. Pt also has a cough and a sore throat. Pt states she feels like her throat is tight. Denies being exposed to known allergens. No rash. Also c.o. Intermittent dizziness.

## 2018-04-11 NOTE — ED Notes (Signed)
Hooked patient up to the monitor patient is resting with call bell in reach 

## 2018-04-11 NOTE — ED Notes (Signed)
Patient is getting into a gown patient is resting with family at bedside

## 2018-04-11 NOTE — ED Provider Notes (Signed)
[facility] EMERGENCY DEPARTMENT Provider Note   CSN: [id number] Arrival date & time: 04/11/18  0915     History   Chief Complaint Chief Complaint  Patient presents with  . Chest Pain  . Dizziness    HPI Mikayla Livingston is a 37 y.o. female.  HPI 37 year old female with history of asthma here with cough, chest pain, and mild lightheadedness.  The patient states that for the last several days, she is had a mild, dry cough.  Now, would last 12 hours, she began to produce yellow-green sputum.  She has had associated shortness of breath.  She has had poor appetite.  She has known sick contacts at work.  She has not had any nausea or vomiting.  No alleviating factors.  Patient does smoke and has a history of asthma.  She states that her shortness of breath is worse with exertion as well as walking in the heat.  Denies any alleviating factors.  She is out of her inhalers.  She continues to smoke.  Past Medical History:  Diagnosis Date  . Asthma     There are no active problems to display for this patient.   No past surgical history on file.   OB History   None      Home Medications    Prior to Admission medications   Medication Sig Start Date End Date Taking? Authorizing Provider  albuterol (PROVENTIL) (2.5 MG/3ML) 0.083% nebulizer solution Take 3 mLs (2.5 mg total) by nebulization every 6 (six) hours as needed for wheezing or shortness of breath. 04/11/18   Shaune Pollack, MD  azithromycin (ZITHROMAX) 250 MG tablet Take 1 tablet (250 mg total) by mouth daily. Take first 2 tablets together, then 1 every day until finished. 04/11/18   Shaune Pollack, MD  guaiFENesin (ROBITUSSIN) 100 MG/5ML liquid Take 5-10 mLs (100-200 mg total) by mouth every 4 (four) hours as needed for cough. Patient not taking: Reported on 07/20/2016 06/20/15   Marlon Pel, PA-C  guaiFENesin-dextromethorphan (ROBITUSSIN DM) 100-10 MG/5ML syrup Take 5 mLs by mouth every 4 (four) hours as needed  for cough. Patient not taking: Reported on 07/20/2016 11/05/15   Jaynie Crumble, PA-C  predniSONE (DELTASONE) 20 MG tablet Take 2 tablets (40 mg total) by mouth daily for 5 days. 04/11/18 04/16/18  Shaune Pollack, MD    Family History No family history on file.  Social History Social History   Tobacco Use  . Smoking status: Current Every Day Smoker    Packs/day: 0.00  . Smokeless tobacco: Never Used  Substance Use Topics  . Alcohol use: No  . Drug use: No     Allergies   Penicillins and Shellfish allergy   Review of Systems Review of Systems  Constitutional: Positive for fatigue. Negative for chills and fever.  HENT: Positive for rhinorrhea. Negative for congestion.   Eyes: Negative for visual disturbance.  Respiratory: Positive for cough, chest tightness and shortness of breath. Negative for wheezing.   Cardiovascular: Negative for chest pain and leg swelling.  Gastrointestinal: Negative for abdominal pain, diarrhea, nausea and vomiting.  Genitourinary: Negative for dysuria and flank pain.  Musculoskeletal: Negative for neck pain and neck stiffness.  Skin: Negative for rash and wound.  Allergic/Immunologic: Negative for immunocompromised state.  Neurological: Negative for syncope, weakness and headaches.  All other systems reviewed and are negative.    Physical Exam Updated Vital Signs BP 106/79   Pulse 60   Temp 98.6 F (37 C) (Oral)   Resp  18   Ht 5\' 5"  (1.651 m)   Wt 59 kg (130 lb)   LMP 03/28/2018   SpO2 100%   BMI 21.63 kg/m   Physical Exam  Constitutional: She is oriented to person, place, and time. She appears well-developed and well-nourished. No distress.  HENT:  Head: Normocephalic and atraumatic.  Moderate nasal congestion.  Posterior frontal erythema without tonsillar swelling or exudates.  Eyes: Conjunctivae are normal.  Neck: Neck supple.  Cardiovascular: Normal rate, regular rhythm and normal heart sounds. Exam reveals no friction rub.    No murmur heard. Pulmonary/Chest: Effort normal. No respiratory distress. She has wheezes (Faint, end expiratory). She has no rales.  Abdominal: She exhibits no distension.  Musculoskeletal: She exhibits no edema.  Neurological: She is alert and oriented to person, place, and time. She exhibits normal muscle tone.  Skin: Skin is warm. Capillary refill takes less than 2 seconds.  Psychiatric: She has a normal mood and affect.  Nursing note and vitals reviewed.    ED Treatments / Results  Labs (all labs ordered are listed, but only abnormal results are displayed) Labs Reviewed  BASIC METABOLIC PANEL - Abnormal; Notable for the following components:      Result Value   Calcium 8.7 (*)    All other components within normal limits  CBC - Abnormal; Notable for the following components:   Hemoglobin 11.5 (*)    MCV 75.3 (*)    MCH 23.5 (*)    All other components within normal limits  I-STAT TROPONIN, ED  I-STAT BETA HCG BLOOD, ED ([facility], [facility], [facility] ONLY)    EKG EKG Interpretation  Date/Time:  Sunday April 11 2018 09:20:26 EDT Ventricular Rate:  52 PR Interval:  132 QRS Duration: 74 QT Interval:  466 QTC Calculation: 433 R Axis:   81 Text Interpretation:  Sinus bradycardia Septal infarct , age undetermined Abnormal ECG No significant change since last tracing Confirmed by Ayyan Sites ([id number]) on 04/11/2018 10:25:33 AM   Radiology Dg Chest 2 View  Result Date: 04/11/2018 CLINICAL DATA:  Chest pain intermittently since Friday EXAM: CHEST - 2 VIEW COMPARISON:  07/03/2017 FINDINGS: Normal heart size and mediastinal contours. Segmentation anomaly at the thoracolumbar junction with left-sided incompletely segmented hemivertebra. There is no edema, consolidation, effusion, or pneumothorax. IMPRESSION: 1. No evidence of active disease. 2. 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 Electronically Signed   By: Jonathon  Watts M.D.   On: 04/11/2018 10:12     Procedures Procedures (including critical care time)  Medications Ordered in ED Medications  AEROCHAMBER PLUS FLO-VU MEDIUM MISC 1 each (has no administration in time range)  albuterol (PROVENTIL HFA;VENTOLIN HFA) 108 (90 Base) MCG/ACT inhaler 2 puff (2 puffs Inhalation Given 04/11/18 1125)  predniSONE (DELTASONE) tablet 60 mg (60 mg Oral Given 04/11/18 1125)  doxycycline (VIBRA-TABS) tablet 100 mg (100 mg Oral Given 04/11/18 1125)     Initial Impression / Assessment and Plan / ED Course  I have reviewed the triage vital signs and the nursing notes.  Pertinent labs & imaging results that were available during my care of the patient were reviewed by me and considered in my medical decision making (see chart for details).     36  year old female here with mild wheezing, cough, sore throat.  Patient has multiple sick contacts.  Patient also with dull chest pressure.  EKG here is nonischemic.  Patient given albuterol.  Will start her on azithromycin for likely bronchitis with underlying asthma component.  Given negative  troponin and reassuring EKG despite constant symptoms for at least the last 12 hours, believe she is safe and stable for discharge. Doubt ACS, PE, dissection.  Final Clinical Impressions(s) / ED Diagnoses   Final diagnoses:  Bronchitis    ED Discharge Orders        Ordered    predniSONE (DELTASONE) 20 MG tablet  Daily     04/11/18 1155    azithromycin (ZITHROMAX) 250 MG tablet  Daily     04/11/18 1155    albuterol (PROVENTIL) (2.5 MG/3ML) 0.083% nebulizer solution  Every 6 hours PRN     04/11/18 1155       Shaune PollackIsaacs, Milanie Rosenfield, MD 04/11/18 1155

## 2018-04-11 NOTE — ED Notes (Signed)
Pt stable, ambulatory, and verbalizes understanding of d/c instructions.  

## 2019-12-05 IMAGING — DX DG CHEST 2V
2 series · 2 of 2 positions shown · non-contrast
Comparison: 07/03/2017

CLINICAL DATA: Chest pain intermittently since [REDACTED]

EXAM:
CHEST - 2 VIEW

[chest pa]
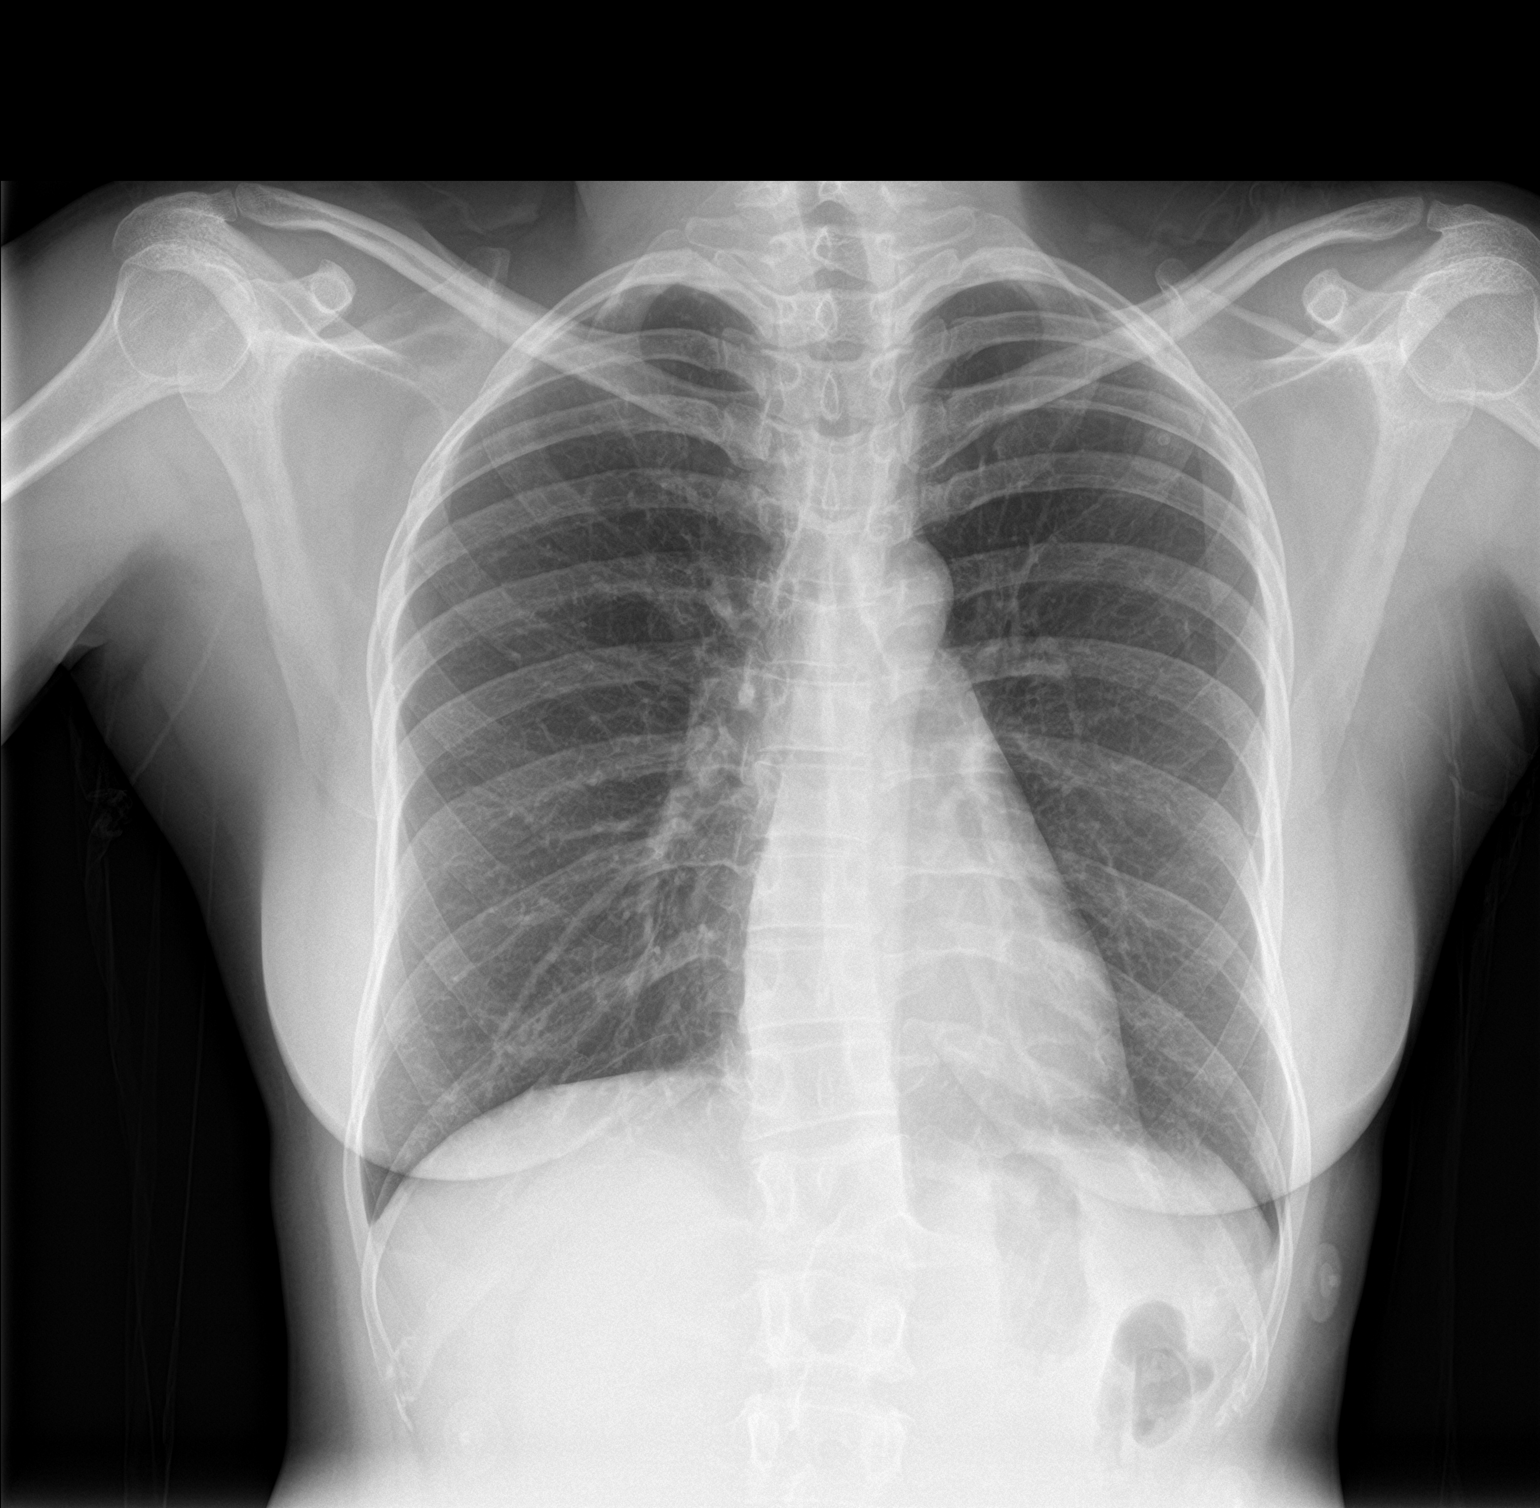

[chest lat]
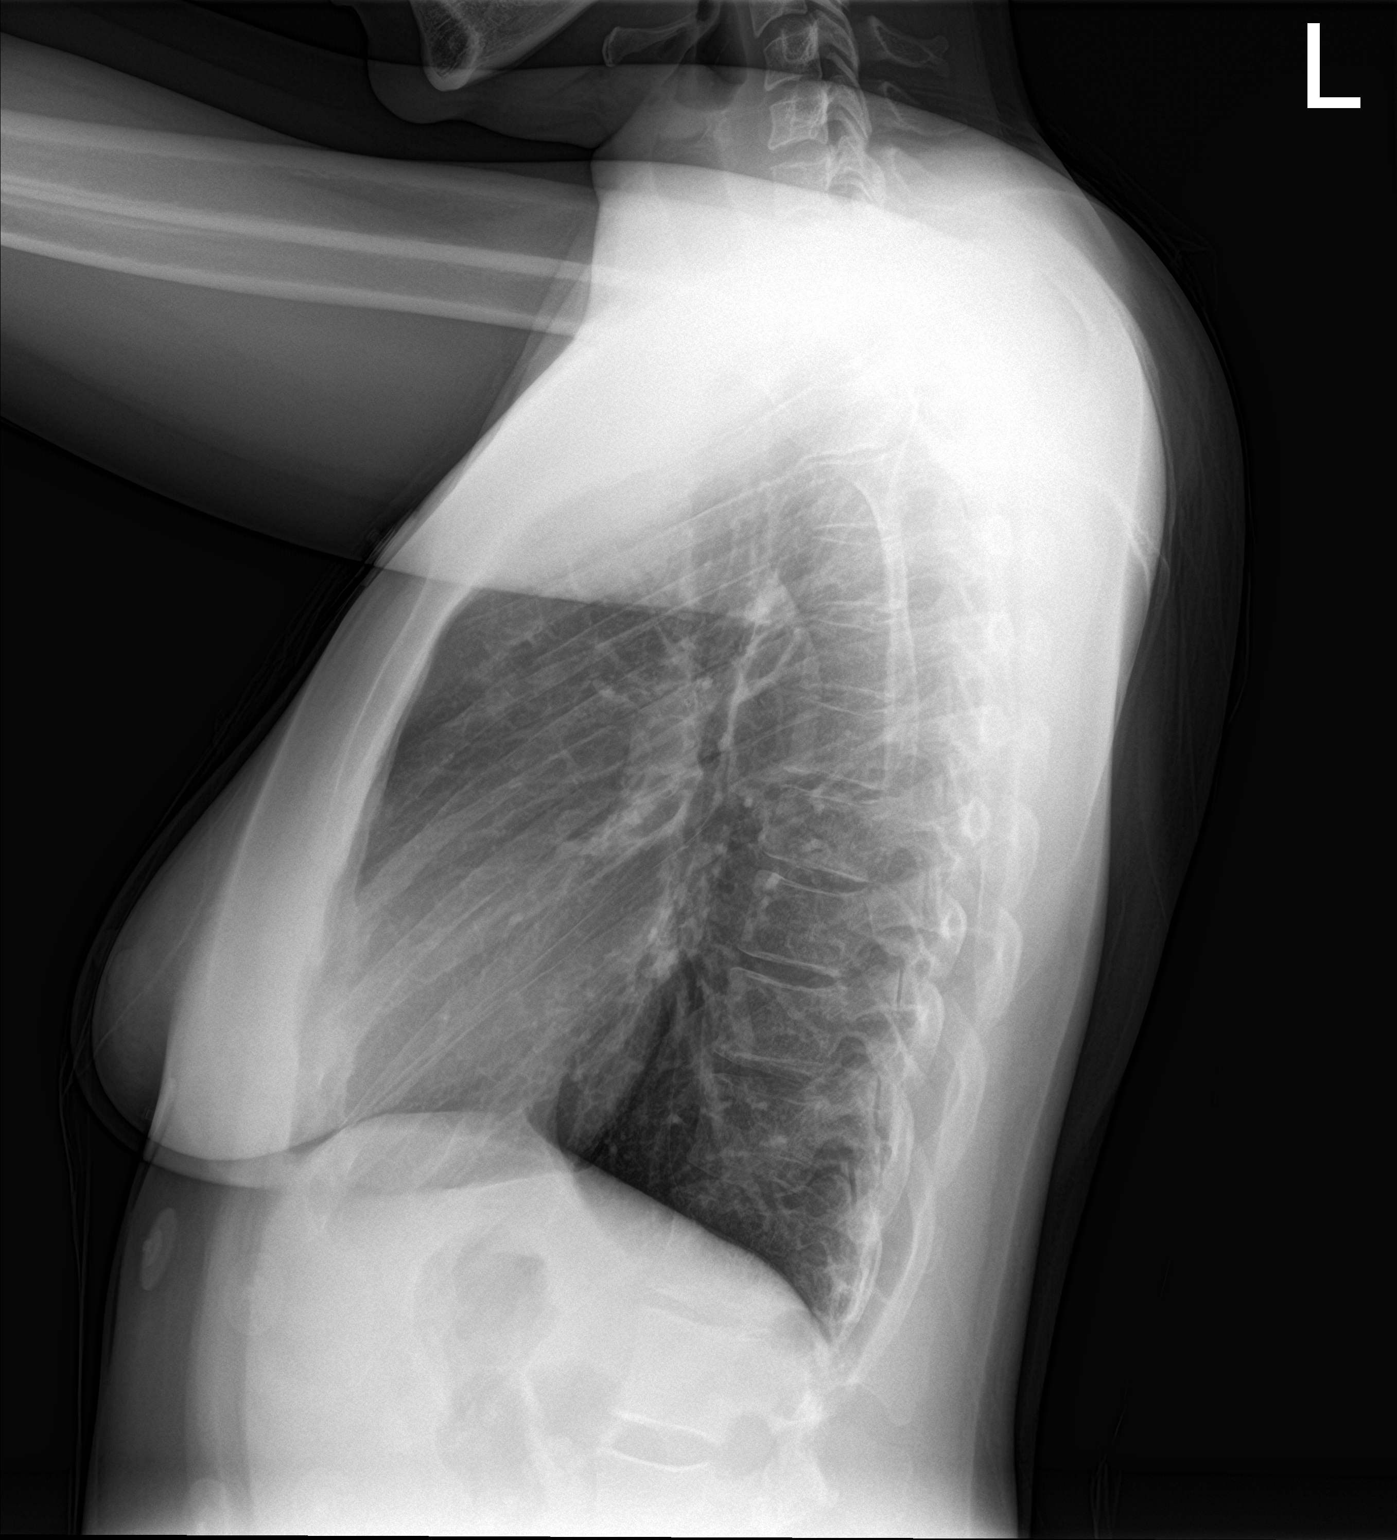

[2 of 2 positions shown; findings below may reference images not displayed]

FINDINGS: Normal heart size and mediastinal contours. Segmentation anomaly at
the thoracolumbar junction with left-sided incompletely segmented
hemivertebra. There is no edema, consolidation, effusion, or
pneumothorax.
IMPRESSION: 1. No evidence of active disease.
2. Left-sided hemivertebra at the thoracolumbar junction with
scoliosis.

## 2021-01-25 ENCOUNTER — Other Ambulatory Visit: Payer: Self-pay

## 2021-01-25 ENCOUNTER — Encounter (HOSPITAL_COMMUNITY): Payer: Self-pay | Admitting: Emergency Medicine

## 2021-01-25 ENCOUNTER — Emergency Department (HOSPITAL_COMMUNITY)
Admission: EM | Admit: 2021-01-25 | Discharge: 2021-01-26 | Discharge status: Against Medical Advice | Payer: Worker's Compensation | Attending: Emergency Medicine | Admitting: Emergency Medicine

## 2021-01-25 DIAGNOSIS — R519 Headache, unspecified: Secondary | ICD-10-CM | POA: Insufficient documentation

## 2021-01-25 DIAGNOSIS — Y990 Civilian activity done for income or pay: Secondary | ICD-10-CM | POA: Diagnosis not present

## 2021-01-25 DIAGNOSIS — R109 Unspecified abdominal pain: Secondary | ICD-10-CM | POA: Insufficient documentation

## 2021-01-25 DIAGNOSIS — R079 Chest pain, unspecified: Secondary | ICD-10-CM | POA: Diagnosis present

## 2021-01-25 DIAGNOSIS — W010XXA Fall on same level from slipping, tripping and stumbling without subsequent striking against object, initial encounter: Secondary | ICD-10-CM | POA: Insufficient documentation

## 2021-01-25 NOTE — ED Triage Notes (Addendum)
Patient slipped on hair at work yesterday , denies LOC/alert and oriented , respirations unlabored , reports generalized body aches.

## 2021-01-25 NOTE — ED Provider Notes (Signed)
Patient placed in Quick Look pathway, seen and evaluated   Chief Complaint: fall yesterday, pain all over  HPI:   Pt reports she slipped and fell yesterday.  Pt complains of pain all over.  Pt complains of a headache.  Pt complains of pain in chest and abdomen   ROS: positive ros for pain in all areas  Physical Exam:   Gen: No distress  Neuro: Awake and Alert  Skin: Warm    Focused Exam: wdwn  Tender chest and abdomen  Pain with moving all extremities   Initiation of care has begun. The patient has been counseled on the process, plan, and necessity for staying for the completion/evaluation, and the remainder of the medical screening examination   Osie Cheeks 01/25/21 1941    Sabino Donovan, MD 01/25/21 [phone]

## 2022-09-12 DIAGNOSIS — Z419 Encounter for procedure for purposes other than remedying health state, unspecified: Secondary | ICD-10-CM | POA: Diagnosis not present

## 2022-10-13 DIAGNOSIS — Z419 Encounter for procedure for purposes other than remedying health state, unspecified: Secondary | ICD-10-CM | POA: Diagnosis not present

## 2022-11-13 DIAGNOSIS — Z419 Encounter for procedure for purposes other than remedying health state, unspecified: Secondary | ICD-10-CM | POA: Diagnosis not present

## 2022-12-12 DIAGNOSIS — Z419 Encounter for procedure for purposes other than remedying health state, unspecified: Secondary | ICD-10-CM | POA: Diagnosis not present

## 2023-01-12 DIAGNOSIS — Z419 Encounter for procedure for purposes other than remedying health state, unspecified: Secondary | ICD-10-CM | POA: Diagnosis not present

## 2023-02-11 DIAGNOSIS — Z419 Encounter for procedure for purposes other than remedying health state, unspecified: Secondary | ICD-10-CM | POA: Diagnosis not present

## 2024-02-05 ENCOUNTER — Ambulatory Visit: Payer: Medicaid Other | Admitting: Family Medicine

## 2024-02-08 ENCOUNTER — Ambulatory Visit (INDEPENDENT_AMBULATORY_CARE_PROVIDER_SITE_OTHER): Admitting: Family Medicine

## 2024-02-08 ENCOUNTER — Encounter: Payer: Self-pay | Admitting: Family Medicine

## 2024-02-08 VITALS — BP 108/71 | HR 83 | Ht 64.5 in | Wt 153.6 lb

## 2024-02-08 DIAGNOSIS — M79602 Pain in left arm: Secondary | ICD-10-CM

## 2024-02-08 DIAGNOSIS — Z7689 Persons encountering health services in other specified circumstances: Secondary | ICD-10-CM | POA: Diagnosis not present

## 2024-02-08 DIAGNOSIS — G8929 Other chronic pain: Secondary | ICD-10-CM

## 2024-02-08 DIAGNOSIS — M79605 Pain in left leg: Secondary | ICD-10-CM | POA: Diagnosis not present

## 2024-02-08 MED ORDER — TRIAMCINOLONE ACETONIDE 40 MG/ML IJ SUSP: 40.0000 mg | Freq: Once | INTRAMUSCULAR | Status: DC

## 2024-02-08 MED ORDER — TRAMADOL HCL 50 MG PO TABS: 50.0000 mg | ORAL_TABLET | Freq: Two times a day (BID) | ORAL | 0 refills | Status: DC

## 2024-02-08 NOTE — Progress Notes (Signed)
 New Patient Office Visit  Subjective    Patient ID: Mikayla Livingston, female    DOB: [birth year]  Age: 43 y.o. MRN: [id number]  CC:  Chief Complaint  Patient presents with   New Patient (Initial Visit)    HPI Mikayla Livingston presents to establish care and for complaint of chronic left upper and lower extremity pain. Patient reports that in 2022 she fell directly on her left side. She had some therapy which she believed helped some but for the past 2 years she has not had any management and sx have worsened. She reports increased pain and difficult utilizing her left extremities. She takes tylenol on a regular basis which is not effective in controlling her sx.    Outpatient Encounter Medications as of 02/08/2024  Medication Sig   albuterol  (PROVENTIL ) (2.5 MG/3ML) 0.083% nebulizer solution Take 3 mLs (2.5 mg total) by nebulization every 6 (six) hours as needed for wheezing or shortness of breath. (Patient not taking: Reported on 02/08/2024)   azithromycin  (ZITHROMAX ) 250 MG tablet Take 1 tablet (250 mg total) by mouth daily. Take first 2 tablets together, then 1 every day until finished. (Patient not taking: Reported on 02/08/2024)   guaiFENesin  (ROBITUSSIN) 100 MG/5ML liquid Take 5-10 mLs (100-200 mg total) by mouth every 4 (four) hours as needed for cough. (Patient not taking: Reported on 07/20/2016)   guaiFENesin -dextromethorphan (ROBITUSSIN DM) 100-10 MG/5ML syrup Take 5 mLs by mouth every 4 (four) hours as needed for cough. (Patient not taking: Reported on 07/20/2016)   No facility-administered encounter medications on file as of 02/08/2024.    Past Medical History:  Diagnosis Date   Asthma     No past surgical history on file.  No family history on file.  Social History   Socioeconomic History   Marital status: Married    Spouse name: Not on file   Number of children: Not on file   Years of education: Not on file   Highest education level: Not on file  Occupational History   Not  on file  Tobacco Use   Smoking status: Every Day    Types: Cigarettes   Smokeless tobacco: Never  Substance and Sexual Activity   Alcohol use: No   Drug use: No   Sexual activity: Never  Other Topics Concern   Not on file  Social History Narrative   Not on file   Social Drivers of Health   Financial Resource Strain: Not on file  Food Insecurity: Not on file  Transportation Needs: Not on file  Physical Activity: Not on file  Stress: Not on file  Social Connections: Unknown (02/22/2022)   Received from [facility], [facility]   Social Network    Social Network: Not on file  Intimate Partner Violence: Unknown (01/16/2022)   Received from [facility], [facility]   HITS    Physically Hurt: Not on file    Insult or Talk Down To: Not on file    Threaten Physical Harm: Not on file    Scream or Curse: Not on file    Review of Systems  All other systems reviewed and are negative.       Objective   BP 108/71 (BP Location: Right Arm, Patient Position: Sitting, Cuff Size: Normal)   Pulse 83   Ht 5' 4.5" (1.638 m)   Wt 153 lb 9.6 oz (69.7 kg)   SpO2 97%   BMI 25.96 kg/m   Physical Exam Vitals and nursing note reviewed.  Constitutional:  General: She is not in acute distress. Cardiovascular:     Rate and Rhythm: Normal rate and regular rhythm.  Pulmonary:     Effort: Pulmonary effort is normal.     Breath sounds: Normal breath sounds.  Musculoskeletal:     Left shoulder: Tenderness present. No swelling or deformity. Decreased range of motion. Decreased strength.     Left elbow: No deformity. Decreased range of motion.     Left wrist: Tenderness present. No deformity. Decreased range of motion.     Left hip: Tenderness present. No deformity. Decreased range of motion.     Left knee: No swelling or deformity. Decreased range of motion. Tenderness present.     Left ankle: No swelling or deformity. Tenderness present. Decreased range of motion.     Left  foot: Decreased range of motion. Tenderness present. No swelling or deformity.  Neurological:     General: No focal deficit present.     Mental Status: She is alert and oriented to person, place, and time.         Assessment & Plan:   Chronic pain of left upper extremity -     Ambulatory referral to Orthopedics  Chronic pain of left lower extremity -     Ambulatory referral to Orthopedics  Encounter to establish care     Return in about 4 weeks (around 03/07/2024) for follow up, physical.   Arlo Lama, MD

## 2024-03-10 ENCOUNTER — Ambulatory Visit (INDEPENDENT_AMBULATORY_CARE_PROVIDER_SITE_OTHER): Admitting: Family Medicine

## 2024-03-10 ENCOUNTER — Encounter: Payer: Self-pay | Admitting: Family Medicine

## 2024-03-10 VITALS — BP 106/68 | HR 90 | Temp 98.4°F | Resp 16 | Ht 64.5 in | Wt 148.0 lb

## 2024-03-10 DIAGNOSIS — Z13228 Encounter for screening for other metabolic disorders: Secondary | ICD-10-CM

## 2024-03-10 DIAGNOSIS — Z1329 Encounter for screening for other suspected endocrine disorder: Secondary | ICD-10-CM | POA: Diagnosis not present

## 2024-03-10 DIAGNOSIS — Z13220 Encounter for screening for lipoid disorders: Secondary | ICD-10-CM

## 2024-03-10 DIAGNOSIS — Z0000 Encounter for general adult medical examination without abnormal findings: Secondary | ICD-10-CM | POA: Diagnosis not present

## 2024-03-10 DIAGNOSIS — Z130 Encounter for screening for diseases of the blood and blood-forming organs and certain disorders involving the immune mechanism: Secondary | ICD-10-CM | POA: Diagnosis not present

## 2024-03-10 DIAGNOSIS — Z1159 Encounter for screening for other viral diseases: Secondary | ICD-10-CM

## 2024-03-10 DIAGNOSIS — Z114 Encounter for screening for human immunodeficiency virus [HIV]: Secondary | ICD-10-CM

## 2024-03-10 NOTE — Progress Notes (Signed)
 Established Patient Office Visit  Subjective    Patient ID: Mikayla Livingston, female    DOB: [birth year]  Age: 43 y.o. MRN: [id number]  CC: No chief complaint on file.   HPI Mikayla Livingston presents for routine annual exam. Patient denies acute complaints.  Outpatient Encounter Medications as of 03/10/2024  Medication Sig   [DISCONTINUED] albuterol  (PROVENTIL ) (2.5 MG/3ML) 0.083% nebulizer solution Take 3 mLs (2.5 mg total) by nebulization every 6 (six) hours as needed for wheezing or shortness of breath. (Patient not taking: Reported on 02/08/2024)   [DISCONTINUED] azithromycin  (ZITHROMAX ) 250 MG tablet Take 1 tablet (250 mg total) by mouth daily. Take first 2 tablets together, then 1 every day until finished. (Patient not taking: Reported on 02/08/2024)   [DISCONTINUED] guaiFENesin  (ROBITUSSIN) 100 MG/5ML liquid Take 5-10 mLs (100-200 mg total) by mouth every 4 (four) hours as needed for cough. (Patient not taking: Reported on 07/20/2016)   [DISCONTINUED] guaiFENesin -dextromethorphan (ROBITUSSIN DM) 100-10 MG/5ML syrup Take 5 mLs by mouth every 4 (four) hours as needed for cough. (Patient not taking: Reported on 07/20/2016)   [DISCONTINUED] traMADol  (ULTRAM ) 50 MG tablet Take 1 tablet (50 mg total) by mouth 2 (two) times daily.   No facility-administered encounter medications on file as of 03/10/2024.    Past Medical History:  Diagnosis Date   Asthma     No past surgical history on file.  No family history on file.  Social History   Socioeconomic History   Marital status: Married    Spouse name: Not on file   Number of children: Not on file   Years of education: Not on file   Highest education level: Not on file  Occupational History   Not on file  Tobacco Use   Smoking status: Every Day    Types: Cigarettes   Smokeless tobacco: Never  Substance and Sexual Activity   Alcohol use: No   Drug use: No   Sexual activity: Never  Other Topics Concern   Not on file  Social History  Narrative   Not on file   Social Drivers of Health   Financial Resource Strain: Low Risk  (03/10/2024)   Overall Financial Resource Strain (CARDIA)    Difficulty of Paying Living Expenses: Not very hard  Food Insecurity: No Food Insecurity (03/10/2024)   Hunger Vital Sign    Worried About Running Out of Food in the Last Year: Never true    Ran Out of Food in the Last Year: Never true  Transportation Needs: No Transportation Needs (03/10/2024)   PRAPARE - Administrator, Civil Service (Medical): No    Lack of Transportation (Non-Medical): No  Physical Activity: Inactive (03/10/2024)   Exercise Vital Sign    Days of Exercise per Week: 0 days    Minutes of Exercise per Session: 0 min  Stress: No Stress Concern Present (03/10/2024)   [employer] of Occupational Health - Occupational Stress Questionnaire    Feeling of Stress : Not at all  Social Connections: Moderately Isolated (03/10/2024)   Social Connection and Isolation Panel [NHANES]    Frequency of Communication with Friends and Family: Three times a week    Frequency of Social Gatherings with Friends and Family: Three times a week    Attends Religious Services: 1 to 4 times per year    Active Member of Clubs or Organizations: No    Attends Banker Meetings: Never    Marital Status: Never married  Intimate Partner Violence: Not At  Risk (03/10/2024)   Humiliation, Afraid, Rape, and Kick questionnaire    Fear of Current or Ex-Partner: No    Emotionally Abused: No    Physically Abused: No    Sexually Abused: No    Review of Systems  All other systems reviewed and are negative.       Objective    BP 106/68   Pulse 90   Temp 98.4 F (36.9 C) (Oral)   Resp 16   Ht 5' 4.5" (1.638 m)   Wt 148 lb (67.1 kg)   SpO2 95%   BMI 25.01 kg/m   Physical Exam Vitals and nursing note reviewed.  Constitutional:      General: She is not in acute distress. HENT:     Head: Normocephalic and atraumatic.      Right Ear: Tympanic membrane, ear canal and external ear normal.     Left Ear: Tympanic membrane, ear canal and external ear normal.     Nose: Nose normal.     Mouth/Throat:     Mouth: Mucous membranes are moist.     Pharynx: Oropharynx is clear.  Eyes:     Conjunctiva/sclera: Conjunctivae normal.     Pupils: Pupils are equal, round, and reactive to light.  Neck:     Thyroid: No thyromegaly.  Cardiovascular:     Rate and Rhythm: Normal rate and regular rhythm.     Heart sounds: Normal heart sounds. No murmur heard. Pulmonary:     Effort: Pulmonary effort is normal. No respiratory distress.     Breath sounds: Normal breath sounds.  Abdominal:     General: There is no distension.     Palpations: Abdomen is soft. There is no mass.     Tenderness: There is no abdominal tenderness.  Musculoskeletal:        General: Normal range of motion.     Cervical back: Normal range of motion and neck supple.  Skin:    General: Skin is warm and dry.  Neurological:     General: No focal deficit present.     Mental Status: She is alert and oriented to person, place, and time.  Psychiatric:        Mood and Affect: Mood normal.        Behavior: Behavior normal.         Assessment & Plan:   Annual physical exam -     CMP14+EGFR  Screening for deficiency anemia -     CBC with Differential/Platelet  Screening for lipid disorders -     Lipid panel  Screening for endocrine/metabolic/immunity disorders -     Hemoglobin A1c -     TSH  Screening for HIV (human immunodeficiency virus) -     HIV Antibody (routine testing w rflx)  Need for hepatitis C screening test -     Hepatitis C antibody     No follow-ups on file.   Arlo Lama, MD

## 2024-03-10 NOTE — Progress Notes (Signed)
 -  Patient is here to have annually  complete physical examination  -Care gap address -labs taken

## 2024-03-11 LAB — CBC WITH DIFFERENTIAL/PLATELET
Basophils Absolute: 0 10*3/uL (ref 0.0–0.2)
Basos: 0 %
EOS (ABSOLUTE): 0.2 10*3/uL (ref 0.0–0.4)
Eos: 2 %
Hematocrit: 42.5 % (ref 34.0–46.6)
Hemoglobin: 13.2 g/dL (ref 11.1–15.9)
Immature Grans (Abs): 0 10*3/uL (ref 0.0–0.1)
Immature Granulocytes: 0 %
Lymphocytes Absolute: 1.9 10*3/uL (ref 0.7–3.1)
Lymphs: 19 %
MCH: 24.6 pg — ABNORMAL LOW (ref 26.6–33.0)
MCHC: 31.1 g/dL — ABNORMAL LOW (ref 31.5–35.7)
MCV: 79 fL (ref 79–97)
Monocytes Absolute: 0.7 10*3/uL (ref 0.1–0.9)
Monocytes: 7 %
Neutrophils Absolute: 7.4 10*3/uL — ABNORMAL HIGH (ref 1.4–7.0)
Neutrophils: 72 %
Platelets: 333 10*3/uL (ref 150–450)
RBC: 5.37 x10E6/uL — ABNORMAL HIGH (ref 3.77–5.28)
RDW: 14.2 % (ref 11.7–15.4)
WBC: 10.2 10*3/uL (ref 3.4–10.8)

## 2024-03-11 LAB — CMP14+EGFR
ALT: 15 IU/L (ref 0–32)
AST: 18 IU/L (ref 0–40)
Albumin: 4.3 g/dL (ref 3.9–4.9)
Alkaline Phosphatase: 57 IU/L (ref 44–121)
BUN/Creatinine Ratio: 18 (ref 9–23)
BUN: 14 mg/dL (ref 6–24)
Bilirubin Total: <0.2 mg/dL (ref 0.0–1.2)
CO2: 20 mmol/L (ref 20–29)
Calcium: 9.3 mg/dL (ref 8.7–10.2)
Chloride: 103 mmol/L (ref 96–106)
Creatinine, Ser: 0.78 mg/dL (ref 0.57–1.00)
Globulin, Total: 2.6 g/dL (ref 1.5–4.5)
Glucose: 93 mg/dL (ref 70–99)
Potassium: 4.4 mmol/L (ref 3.5–5.2)
Sodium: 138 mmol/L (ref 134–144)
Total Protein: 6.9 g/dL (ref 6.0–8.5)
eGFR: 98 mL/min/{1.73_m2} (ref >59)

## 2024-03-11 LAB — LIPID PANEL
Chol/HDL Ratio: 3.9 ratio (ref 0.0–4.4)
Cholesterol, Total: 154 mg/dL (ref 100–199)
HDL: 39 mg/dL — ABNORMAL LOW (ref >39)
LDL Chol Calc (NIH): 81 mg/dL (ref 0–99)
Triglycerides: 201 mg/dL — ABNORMAL HIGH (ref 0–149)
VLDL Cholesterol Cal: 34 mg/dL (ref 5–40)

## 2024-03-11 LAB — TSH: TSH: 0.28 u[IU]/mL — ABNORMAL LOW (ref 0.450–4.500)

## 2024-03-11 LAB — HEMOGLOBIN A1C
Est. average glucose Bld gHb Est-mCnc: 114 mg/dL
Hgb A1c MFr Bld: 5.6 % (ref 4.8–5.6)

## 2024-03-11 LAB — HIV ANTIBODY (ROUTINE TESTING W REFLEX): HIV Screen 4th Generation wRfx: NONREACTIVE

## 2024-03-11 LAB — HEPATITIS C ANTIBODY: Hep C Virus Ab: NONREACTIVE

## 2024-03-17 ENCOUNTER — Other Ambulatory Visit (INDEPENDENT_AMBULATORY_CARE_PROVIDER_SITE_OTHER): Payer: Self-pay

## 2024-03-17 ENCOUNTER — Ambulatory Visit: Admitting: Physician Assistant

## 2024-03-17 ENCOUNTER — Encounter: Payer: Self-pay | Admitting: Physician Assistant

## 2024-03-17 DIAGNOSIS — G8929 Other chronic pain: Secondary | ICD-10-CM

## 2024-03-17 DIAGNOSIS — M25512 Pain in left shoulder: Secondary | ICD-10-CM | POA: Diagnosis not present

## 2024-03-17 DIAGNOSIS — M542 Cervicalgia: Secondary | ICD-10-CM | POA: Diagnosis not present

## 2024-03-17 MED ORDER — MELOXICAM 15 MG PO TABS: 15.0000 mg | ORAL_TABLET | Freq: Every day | ORAL | 0 refills | Status: AC

## 2024-03-17 NOTE — Addendum Note (Signed)
 Addended by: Georgann Kim on: 03/17/2024 02:17 PM   Modules accepted: Orders

## 2024-03-17 NOTE — Progress Notes (Signed)
 Office Visit Note   Patient: Mikayla Livingston           Date of Birth: [birth year]           MRN: [id number] Visit Date: 03/17/2024              Requested by: Abraham Abo, MD [address] [place],  [place] [address] PCP: Abraham Abo, MD   Assessment & Plan: Visit Diagnoses:  1. Chronic left shoulder pain   2. Neck pain on left side     Plan: Mikayla Livingston is a pleasant 43 year old woman who presents today with a 3-year history of neck pain that radiates down her left arm.  This originally happened when she fell at work.  She has tried medications including gabapentin and ibuprofen.  She is also tried physical therapy without improvement in her symptoms.  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  She is concerned because she is getting more weakness in the left arm.  She also has a history of low back issues as well from the same fall but we planned on focusing it first on her cervical spine.  She does have decreased grip strength especially.  Decreased sensation distribution in all of her fingers.  She has significant pain with extension of her neck and turning it side-to-side.  Bicep strength is okay.  Will plan on getting an MRI of her cervical spine and have her follow-up with Mikayla Livingston.    Follow-Up Instructions: After MRI  Orders:  Orders Placed This Encounter  Procedures   XR Cervical Spine 2 or 3 views   Meds ordered this encounter  Medications   meloxicam (MOBIC) 15 MG tablet    Sig: Take 1 tablet (15 mg total) by mouth daily.    Dispense:  30 tablet    Refill:  0      Procedures: No procedures performed   Clinical Data: No additional findings.   Subjective: No chief complaint on file.   HPI patient is a pleasant 43 year old woman who comes in today with a chief complaint of left shoulder and neck pain.  She has shoulder pain that radiates down her arm to her fingers with  associated paresthesias.  The fingers feel numb she has no feeling.  She has no good grip strength.  She also has some low back pain on the left side that causes numbness down to her feet.  Review of Systems   Objective: Vital Signs: There were no vitals taken for this visit.  Physical Exam Constitutional:      Appearance: Normal appearance.  Pulmonary:     Effort: Pulmonary effort is normal.     Breath sounds: Normal breath sounds.  Skin:    General: Skin is warm and dry.  Neurological:     General: No focal deficit present.     Mental Status: She is alert and oriented to person, place, and time.  Psychiatric:        Mood and Affect: Mood normal.        Behavior: Behavior normal.     Ortho Exam Examination of her neck she has no pain with flexion but has significant pain and stiffness with extension of her neck she is able to turn side-to-side.  She is tender in the paravertebral left side of her neck with with motion.  This radiates down her arm into her  hand and causes paresthesias.  Biceps tricep strength and abductor strength is okay she does have decreased grip strength neurovascularly she is intact Specialty Comments:  No specialty comments available.  Imaging: XR Cervical Spine 2 or 3 views Result Date: 03/17/2024 Slight loss of normal lordotic curve and overall well-preserved joint spacing and vertebral bodies    PMFS History: There are no active problems to display for this patient.  Past Medical History:  Diagnosis Date   Asthma     History reviewed. No pertinent family history.  History reviewed. No pertinent surgical history. Social History   Occupational History   Not on file  Tobacco Use   Smoking status: Every Day    Types: Cigarettes   Smokeless tobacco: Never  Substance and Sexual Activity   Alcohol use: No   Drug use: No   Sexual activity: Never

## 2024-03-24 ENCOUNTER — Encounter: Payer: Self-pay | Admitting: Orthopedic Surgery

## 2024-03-26 ENCOUNTER — Other Ambulatory Visit

## 2024-04-21 ENCOUNTER — Other Ambulatory Visit: Payer: Self-pay | Admitting: Radiology

## 2024-04-21 DIAGNOSIS — M542 Cervicalgia: Secondary | ICD-10-CM

## 2024-04-26 ENCOUNTER — Ambulatory Visit: Admitting: Physician Assistant

## 2024-04-27 ENCOUNTER — Ambulatory Visit: Admitting: Family Medicine

## 2024-05-07 NOTE — Therapy (Unsigned)
 OUTPATIENT PHYSICAL THERAPY SHOULDER EVALUATION   Patient Name: Mikayla Livingston MRN: [id number] DOB:1981/02/21, 43 y.o., female Today's Date: 05/09/2024   PT End of Session - 05/09/24 1156     Visit Number 1    Number of Visits --   1-2x/week   Date for PT Re-Evaluation 07/04/24    Authorization Type [employer]    PT Start Time 1108    PT Stop Time 1145    PT Time Calculation (min) 37 min          Past Medical History:  Diagnosis Date   Asthma    History reviewed. No pertinent surgical history. There are no active problems to display for this patient.   PCP: Mikayla Bleacher, MD  REFERRING PROVIDER: Persons, Mikayla Livingston, Mikayla Livingston  THERAPY DIAG:  Cervicalgia  Right shoulder pain, unspecified chronicity  Muscle weakness  REFERRING DIAG: Neck pain on left side [M54.2], Cervicalgia [M54.2]   Rationale for Evaluation and Treatment:  Rehabilitation  SUBJECTIVE:  PERTINENT PAST HISTORY:  none        PRECAUTIONS: None  WEIGHT BEARING RESTRICTIONS No  FALLS:  Has patient fallen in last 6 months? No, Number of falls: 0  MOI/History of condition:  Onset date: 3 years  SUBJECTIVE STATEMENT  Mikayla Livingston is a 43 y.o. female who presents to clinic with chief complaint of chronic L sided neck and arm pain.  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  She has weakness and n/t in her L arm.  She is very sensitive to even light touch.  She has completed PT in the past and this was painful and only minimally helpful.  She is required to do PT before getting an MRI.  She is unable to work d/t the pain.  She was previously very active with tennis and walking/hiking. She participates in some modified yoga.  She also has L sided LE pain.  She is very sensitive to touch in the arm and L LE.  From referring provider:   Plan: Mikayla Livingston is a pleasant 43 year old woman who presents today with a 3-year history  of neck pain that radiates down her left arm.  This originally happened when she fell at work.  She has tried medications including gabapentin and ibuprofen.  She is also tried physical therapy without improvement in her symptoms.  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  She is concerned because she is getting more weakness in the left arm.  She also has a history of low back issues as well from the same fall but we planned on focusing it first on her cervical spine.  She does have decreased grip strength especially.  Decreased sensation distribution in all of her fingers.  She has significant pain with extension of her neck and turning it side-to-side.  Bicep strength is okay.  Will plan on getting an MRI of her cervical spine and have her follow-up with Mikayla Livingston.   Pain:  Are you having pain? Yes Pain location: L sided neck and L UE pain NPRS scale:  BEST: 3/10, Worst: 10/10, Average: 7/10 Aggravating factors: show (washing), dressing, cooking Relieving factors: go to sleep Pain description: sharp and throbbing, shooting, variable  Occupation: caregiver  Assistive Device: NA  Hand Dominance: R  Patient Goals/Specific Activities:  reduce pain, improve UE function   OBJECTIVE:   DIAGNOSTIC FINDINGS:  MRI ordered  GENERAL OBSERVATION: Forward head, rounded shoulders     SENSATION: Light touch: N/T fingers of R hand   PALPATION: Exquisite TTP throughout Cx spine and periscapular musculature, sensitive to light touch throughout L UE.  Cervical ROM  ROM ROM  (Eval)  Flexion 50  Extension 30*  Right lateral flexion 45  Left lateral flexion 40*  Right rotation 60  Left rotation 45*  Flexion rotation (normal is 30 degrees)   Flexion rotation (normal is 30 degrees)     (Blank rows = not tested, N = WNL, * = concordant pain)  UPPER EXTREMITY MMT:  MMT Right (Eval) Left (Eval)  Shoulder  flexion 3* 3*  Shoulder abduction (C5)    Shoulder ER 3+* 3*  Shoulder IR    Middle trapezius    Lower trapezius    Shoulder extension    Grip strength 25 <5 lbs  Shoulder shrug (C4)    Elbow flexion (C6)    Elbow ext (C7)    Thumb ext (C8)    Finger abd (T1)    Grossly     (Blank rows = not tested, score listed is out of 5 possible points.  N = WNL, D = diminished, C = clear for gross weakness with myotome testing, * = concordant pain with testing)  UPPER EXTREMITY AROM:  ROM Right (Eval) Left (Eval)  Shoulder flexion 150 110*  Shoulder abduction    Shoulder internal rotation    Shoulder external rotation    Functional IR    Functional ER    Shoulder extension    Elbow extension    Elbow flexion     (Blank rows = not tested, N = WNL, * = concordant pain with testing)     SPECIAL TESTS:  Spurling's: ~ d/t high pain levels    PATIENT SURVEYS:  Quick dash:     TODAY'S TREATMENT:  Therapeutic Exercise: Creating, reviewing, and completing below HEP    PATIENT EDUCATION ([place]/[facility]):  POC, diagnosis, prognosis, HEP, and outcome measures.  Pt educated via explanation, demonstration, and handout (HEP).  Pt confirms understanding verbally.    HOME EXERCISE PROGRAM:  [email or web address]  Treatment priorities   Eval        desensitization        sleep        Stress reduction        aquatics                  ASSESSMENT:  CLINICAL IMPRESSION: Mikayla Livingston is a 43 y.o. female who presents to clinic with signs and sxs consistent with chronic neck and L>R UE and LE pain following a fall about 3 years ago.  The pain is variable but often debilitating.  She is no longer able to work or participate in recreation.  Given pain severity it is difficult to accurately assess any specific structure as all movements are provocative.  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   Provided desensitization program and will start  with aquatic therapy for entry point into execise/movement.Mikayla Livingston will benefit from skilled PT to address relevant deficits and improve comfort with daily activities like reaching, lifting, and self care.   OBJECTIVE IMPAIRMENTS: Pain, shoulder ROM, cervical ROM, UE strength  ACTIVITY LIMITATIONS: lifting, carrying, recreation, work, self care, reaching, housework  PERSONAL FACTORS: See medical history and pertinent history   REHAB POTENTIAL: Good  CLINICAL DECISION MAKING: Evolving/moderate complexity  EVALUATION COMPLEXITY: Moderate   GOALS:   SHORT TERM GOALS: Target date: 06/06/2024  Alaze will be >75% HEP compliant to improve carryover between sessions and facilitate independent management of condition  Evaluation: ongoing Goal status: INITIAL   LONG TERM GOALS: Target date: 07/04/2024   Vonceil will self report >/= 50% decrease in pain from evaluation to improve function in daily tasks  Evaluation/Baseline: 10/10 max pain Goal status: INITIAL   2.  Jennica will show a >/= 30 pct improvement in her QUICK DASH score (MCID is 10% or ~5 pts) as a proxy for functional improvement   Evaluation/Baseline: QuickDASH Score: 79.5 / 100 = 79.5 % Goal status: INITIAL   3.  Aislinn will be able to take a shower, not limited by pain  Evaluation/Baseline: limited Goal status: INITIAL   4.  Tovah will report confidence in self management of condition at time of discharge with advanced HEP  Evaluation/Baseline: unable to self manage Goal status: INITIAL   5.  Anabella will be able to participate in regular exercise as appropriate, not limited by pain  Evaluation/Baseline: limited Goal status: INITIAL    PLAN: PT FREQUENCY: 1-2x/week  PT DURATION: 8 weeks  PLANNED INTERVENTIONS:  97164- PT Re-evaluation, 97110-Therapeutic exercises, 97530- Therapeutic activity, [id number]- Neuromuscular re-education, 97535- Self Care, 02859- Manual therapy, [id number]- Gait training, [id number]- Aquatic  Therapy, [phone]- Electrical stimulation (manual), [id number]- Vasopneumatic device, [id number]- Traction (mechanical), [id number]- Ionotophoresis 4mg /ml Dexamethasone, Taping, Dry Needling, Joint manipulation, and Spinal manipulation.   Yousra Ivens PT, DPT 05/09/2024, 11:58 AM  I just finished a MCD eval/recert.  Name: Maryl Blalock  MRN: [id number] Please request 2x/week for 8 weeks.  Check all conditions that are expected to impact treatment: Musculoskeletal disorders   I DID put a charge in.  Check all possible CPT codes: 02889- Therapeutic Exercise, [phone]- Neuro Re-education, [phone] - Gait Training, [phone] - Manual Therapy, 97530 - Therapeutic Activities, 97535 - Self Care, [phone] - Re-evaluation, [id number] - Mechanical traction, and [id number] - Aquatic therapy   Thank you!  MCD - Secure

## 2024-05-09 ENCOUNTER — Encounter: Payer: Self-pay | Admitting: Physical Therapy

## 2024-05-09 ENCOUNTER — Other Ambulatory Visit: Payer: Self-pay

## 2024-05-09 ENCOUNTER — Ambulatory Visit: Attending: Physician Assistant | Admitting: Physical Therapy

## 2024-05-09 DIAGNOSIS — M25511 Pain in right shoulder: Secondary | ICD-10-CM | POA: Diagnosis present

## 2024-05-09 DIAGNOSIS — M6281 Muscle weakness (generalized): Secondary | ICD-10-CM | POA: Insufficient documentation

## 2024-05-09 DIAGNOSIS — M542 Cervicalgia: Secondary | ICD-10-CM | POA: Diagnosis present

## 2024-05-18 ENCOUNTER — Encounter: Admitting: Physical Therapy

## 2024-05-23 ENCOUNTER — Ambulatory Visit: Payer: Self-pay | Admitting: Family Medicine

## 2024-05-24 ENCOUNTER — Ambulatory Visit: Attending: Physician Assistant | Admitting: Physical Therapy

## 2024-05-24 ENCOUNTER — Encounter: Payer: Self-pay | Admitting: Physical Therapy

## 2024-05-24 DIAGNOSIS — M25511 Pain in right shoulder: Secondary | ICD-10-CM | POA: Diagnosis present

## 2024-05-24 DIAGNOSIS — M542 Cervicalgia: Secondary | ICD-10-CM | POA: Diagnosis present

## 2024-05-24 DIAGNOSIS — M6281 Muscle weakness (generalized): Secondary | ICD-10-CM | POA: Insufficient documentation

## 2024-05-24 NOTE — Therapy (Signed)
 OUTPATIENT PHYSICAL THERAPY SHOULDER EVALUATION   Patient Name: Mikayla Livingston MRN: [id number] DOB:[birth year], 42 y.o., female Today's Date: 05/24/2024   PT End of Session - 05/24/24 1419     Visit Number 2    Number of Visits --   1-2x/week   Date for PT Re-Evaluation 07/04/24    Authorization Type [employer]    PT Start Time 1418    PT Stop Time 1445    PT Time Calculation (min) 27 min          Past Medical History:  Diagnosis Date   Asthma    History reviewed. No pertinent surgical history. There are no active problems to display for this patient.   PCP: Tanda Bleacher, MD  REFERRING PROVIDER: Persons, Ronal Dragon, [place]  THERAPY DIAG:  Cervicalgia  Right shoulder pain, unspecified chronicity  Muscle weakness  REFERRING DIAG: Neck pain on left side [M54.2], Cervicalgia [M54.2]   Rationale for Evaluation and Treatment:  Rehabilitation  SUBJECTIVE:  PERTINENT PAST HISTORY:  none        PRECAUTIONS: None  WEIGHT BEARING RESTRICTIONS No  FALLS:  Has patient fallen in last 6 months? No, Number of falls: 0  MOI/History of condition:  Onset date: 3 years  SUBJECTIVE STATEMENT  05/24/2024:  Pt reports that she has been working on the desensitization techniques with some small improvement.  Her pain is still up and down and varies significantly from day to day.    EVAL: Sameria Livingston is a 42 y.o. female who presents to clinic with chief complaint of chronic L sided neck and arm pain.  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  She has weakness and n/t in her L arm.  She is very sensitive to even light touch.  She has completed PT in the past and this was painful and only minimally helpful.  She is required to do PT before getting an MRI.  She is unable to work d/t the pain.  She was previously very active with tennis and walking/hiking. She participates in some modified yoga.  She  also has L sided LE pain.  She is very sensitive to touch in the arm and L LE.  From referring provider:   Plan: Mikayla Livingston is a pleasant 43 year old woman who presents today with a 3-year history of neck pain that radiates down her left arm.  This originally happened when she fell at work.  She has tried medications including gabapentin and ibuprofen.  She is also tried physical therapy without improvement in her symptoms.  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  She is concerned because she is getting more weakness in the left arm.  She also has a history of low back issues as well from the same fall but we planned on focusing it first on her cervical spine.  She does have decreased grip strength especially.  Decreased sensation distribution in all of her fingers.  She has significant pain with extension of her neck and turning it side-to-side.  Bicep strength is okay.  Will plan on getting an MRI of her cervical spine and have her follow-up with Megan.   Pain:  Are you having pain? Yes Pain location: L sided neck and L UE pain NPRS scale:  BEST: 3/10, Worst:  10/10, Average: 7/10 Aggravating factors: show (washing), dressing, cooking Relieving factors: go to sleep Pain description: sharp and throbbing, shooting, variable  Occupation: caregiver  Assistive Device: NA  Hand Dominance: R  Patient Goals/Specific Activities: reduce pain, improve UE function   OBJECTIVE:   DIAGNOSTIC FINDINGS:  MRI ordered  GENERAL OBSERVATION: Forward head, rounded shoulders     SENSATION: Light touch: N/T fingers of R hand   PALPATION: Exquisite TTP throughout Cx spine and periscapular musculature, sensitive to light touch throughout L UE.  Cervical ROM  ROM ROM  (Eval)  Flexion 50  Extension 30*  Right lateral flexion 45  Left lateral flexion 40*  Right rotation 60  Left rotation 45*  Flexion rotation  (normal is 30 degrees)   Flexion rotation (normal is 30 degrees)     (Blank rows = not tested, N = WNL, * = concordant pain)  UPPER EXTREMITY MMT:  MMT Right (Eval) Left (Eval)  Shoulder flexion 3* 3*  Shoulder abduction (C5)    Shoulder ER 3+* 3*  Shoulder IR    Middle trapezius    Lower trapezius    Shoulder extension    Grip strength 25 <5 lbs  Shoulder shrug (C4)    Elbow flexion (C6)    Elbow ext (C7)    Thumb ext (C8)    Finger abd (T1)    Grossly     (Blank rows = not tested, score listed is out of 5 possible points.  N = WNL, D = diminished, C = clear for gross weakness with myotome testing, * = concordant pain with testing)  UPPER EXTREMITY AROM:  ROM Right (Eval) Left (Eval)  Shoulder flexion 150 110*  Shoulder abduction    Shoulder internal rotation    Shoulder external rotation    Functional IR    Functional ER    Shoulder extension    Elbow extension    Elbow flexion     (Blank rows = not tested, N = WNL, * = concordant pain with testing)     SPECIAL TESTS:  Spurling's: ~ d/t high pain levels    PATIENT SURVEYS:  Quick dash:     TODAY'S TREATMENT:   [facility] Adult PT Treatment  05/24/2024:  Therapeutic Exercise:  nu-step - not tolerated d/t L leg and L UE pain Gripping putty in supine with hot pack applied  Manual: Not tolerated   Therapeutic Activity  Discussion on relaxation techniques including mindfulness and deep breathing      HOME EXERCISE PROGRAM:  [email or web address]  Treatment priorities   Eval        desensitization        sleep        Stress reduction        aquatics                  ASSESSMENT:  CLINICAL IMPRESSION:  05/24/2024: Mikayla Livingston had a difficult time tolerating session d/t pain.  We attempted warm up on nu-step which increased both L UE and L LE pain significantly.  Hot pack applied to pt in supine provided some relief.  Attempted putty squeeze but pt  unable d/t pain.  Discussed relaxation techniques but pt is already completing many of these and finds them more beneficial for her mental well being than physical.  Ended session early d/t high pain levels.  Will trial aquatics and reach out to pts MD's about possible referral to pain management.  EVAL:  Mikayla Livingston is a 43 y.o. female  who presents to clinic with signs and sxs consistent with chronic neck and L>R UE and LE pain following a fall about 3 years ago.  The pain is variable but often debilitating.  She is no longer able to work or participate in recreation.  Given pain severity it is difficult to accurately assess any specific structure as all movements are provocative.  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   Provided desensitization program and will start with aquatic therapy for entry point into execise/movement.SABRA Beckers will benefit from skilled PT to address relevant deficits and improve comfort with daily activities like reaching, lifting, and self care.   OBJECTIVE IMPAIRMENTS: Pain, shoulder ROM, cervical ROM, UE strength  ACTIVITY LIMITATIONS: lifting, carrying, recreation, work, self care, reaching, housework  PERSONAL FACTORS: See medical history and pertinent history   REHAB POTENTIAL: Good  CLINICAL DECISION MAKING: Evolving/moderate complexity  EVALUATION COMPLEXITY: Moderate   GOALS:   SHORT TERM GOALS: Target date: 06/06/2024  Avika will be >75% HEP compliant to improve carryover between sessions and facilitate independent management of condition  Evaluation: ongoing Goal status: INITIAL   LONG TERM GOALS: Target date: 07/04/2024   Harmony will self report >/= 50% decrease in pain from evaluation to improve function in daily tasks  Evaluation/Baseline: 10/10 max pain Goal status: INITIAL   2.  Quanika will show a >/= 30 pct improvement in her QUICK DASH score (MCID is 10% or ~5 pts) as a proxy for functional improvement   Evaluation/Baseline:  QuickDASH Score: 79.5 / 100 = 79.5 % Goal status: INITIAL   3.  Lenda will be able to take a shower, not limited by pain  Evaluation/Baseline: limited Goal status: INITIAL   4.  Anita will report confidence in self management of condition at time of discharge with advanced HEP  Evaluation/Baseline: unable to self manage Goal status: INITIAL   5.  Fallynn will be able to participate in regular exercise as appropriate, not limited by pain  Evaluation/Baseline: limited Goal status: INITIAL    PLAN: PT FREQUENCY: 1-2x/week  PT DURATION: 8 weeks  PLANNED INTERVENTIONS:  97164- PT Re-evaluation, 97110-Therapeutic exercises, 97530- Therapeutic activity, [id number]- Neuromuscular re-education, 97535- Self Care, 02859- Manual therapy, [id number]- Gait training, [id number]- Aquatic Therapy, [phone]- Electrical stimulation (manual), [id number]- Vasopneumatic device, [id number]- Traction (mechanical), [id number]- Ionotophoresis 4mg /ml Dexamethasone, Taping, Dry Needling, Joint manipulation, and Spinal manipulation.   Terel Bann PT, DPT 05/24/2024, 4:00 PM  I just finished a MCD eval/recert.  Name: Johniya Durfee  MRN: [id number] Please request 2x/week for 8 weeks.  Check all conditions that are expected to impact treatment: Musculoskeletal disorders   I DID put a charge in.  Check all possible CPT codes: 02889- Therapeutic Exercise, [phone]- Neuro Re-education, [phone] - Gait Training, [phone] - Manual Therapy, 97530 - Therapeutic Activities, 97535 - Self Care, [phone] - Re-evaluation, [id number] - Mechanical traction, and [id number] - Aquatic therapy   Thank you!  MCD - Secure

## 2024-05-25 ENCOUNTER — Encounter: Admitting: Physical Therapy

## 2024-05-30 ENCOUNTER — Ambulatory Visit: Admitting: Family Medicine

## 2024-06-01 ENCOUNTER — Encounter: Admitting: Physical Therapy

## 2024-06-02 ENCOUNTER — Ambulatory Visit

## 2024-06-07 ENCOUNTER — Encounter: Admitting: Physical Therapy

## 2024-06-09 ENCOUNTER — Ambulatory Visit: Payer: Self-pay

## 2024-06-09 NOTE — Therapy (Incomplete)
 OUTPATIENT PHYSICAL THERAPY SHOULDER EVALUATION   Patient Name: Mikayla Livingston MRN: [id number] DOB:Aug 16, 1981, 43 y.o., female Today's Date: 06/09/2024     Past Medical History:  Diagnosis Date   Asthma    No past surgical history on file. There are no active problems to display for this patient.   PCP: Tanda Bleacher, MD  REFERRING PROVIDER: Tanda Bleacher, MD  THERAPY DIAG:  No diagnosis found.  REFERRING DIAG: Neck pain on left side [M54.2], Cervicalgia [M54.2]   Rationale for Evaluation and Treatment:  Rehabilitation  SUBJECTIVE:  PERTINENT PAST HISTORY:  none        PRECAUTIONS: None  WEIGHT BEARING RESTRICTIONS No  FALLS:  Has patient fallen in last 6 months? No, Number of falls: 0  MOI/History of condition:  Onset date: 3 years  SUBJECTIVE STATEMENT 06/09/2024:  ***  Pt reports that she has been working on the desensitization techniques with some small improvement.  Her pain is still up and down and varies significantly from day to day.    EVAL: Mikayla Livingston is a 43 y.o. female who presents to clinic with chief complaint of chronic L sided neck and arm pain.  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  She has weakness and n/t in her L arm.  She is very sensitive to even light touch.  She has completed PT in the past and this was painful and only minimally helpful.  She is required to do PT before getting an MRI.  She is unable to work d/t the pain.  She was previously very active with tennis and walking/hiking. She participates in some modified yoga.  She also has L sided LE pain.  She is very sensitive to touch in the arm and L LE.  From referring provider:   Plan: Mikayla Livingston is a pleasant 43 year old woman who presents today with a 3-year history of neck pain that radiates down her left arm.  This originally happened when she fell at work.  She has tried medications  including gabapentin and ibuprofen.  She is also tried physical therapy without improvement in her symptoms.  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  She is concerned because she is getting more weakness in the left arm.  She also has a history of low back issues as well from the same fall but we planned on focusing it first on her cervical spine.  She does have decreased grip strength especially.  Decreased sensation distribution in all of her fingers.  She has significant pain with extension of her neck and turning it side-to-side.  Bicep strength is okay.  Will plan on getting an MRI of her cervical spine and have her follow-up with Mikayla Livingston.   Pain:  Are you having pain? Yes Pain location: L sided neck and L UE pain NPRS scale:  BEST: 3/10, Worst: 10/10, Average: 7/10 Aggravating factors: show (washing), dressing, cooking Relieving factors: go to sleep Pain description: sharp and throbbing, shooting, variable  Occupation: caregiver  Assistive Device: NA  Hand Dominance: R  Patient Goals/Specific Activities: reduce pain, improve UE function   OBJECTIVE:   DIAGNOSTIC FINDINGS:  MRI ordered  GENERAL OBSERVATION: Forward head, rounded shoulders     SENSATION: Light touch: N/T fingers of R hand   PALPATION: Exquisite  TTP throughout Cx spine and periscapular musculature, sensitive to light touch throughout L UE.  Cervical ROM  ROM ROM  (Eval)  Flexion 50  Extension 30*  Right lateral flexion 45  Left lateral flexion 40*  Right rotation 60  Left rotation 45*  Flexion rotation (normal is 30 degrees)   Flexion rotation (normal is 30 degrees)     (Blank rows = not tested, N = WNL, * = concordant pain)  UPPER EXTREMITY MMT:  MMT Right (Eval) Left (Eval)  Shoulder flexion 3* 3*  Shoulder abduction (C5)    Shoulder ER 3+* 3*  Shoulder IR    Middle trapezius    Lower trapezius     Shoulder extension    Grip strength 25 <5 lbs  Shoulder shrug (C4)    Elbow flexion (C6)    Elbow ext (C7)    Thumb ext (C8)    Finger abd (T1)    Grossly     (Blank rows = not tested, score listed is out of 5 possible points.  N = WNL, D = diminished, C = clear for gross weakness with myotome testing, * = concordant pain with testing)  UPPER EXTREMITY AROM:  ROM Right (Eval) Left (Eval)  Shoulder flexion 150 110*  Shoulder abduction    Shoulder internal rotation    Shoulder external rotation    Functional IR    Functional ER    Shoulder extension    Elbow extension    Elbow flexion     (Blank rows = not tested, N = WNL, * = concordant pain with testing)     SPECIAL TESTS:  Spurling's: ~ d/t high pain levels    PATIENT SURVEYS:  Quick dash:     TODAY'S TREATMENT:  [facility] Adult PT Treatment:                                                DATE: 06/09/24 Aquatic therapy at [facility]- [address] - therapeutic pool temp approximately 91 degrees. Pt enters building ***. Treatment took place in water 3.8 to  4 ft 8 in. deep depending upon activity.  Pt entered and exited the pool via stair and handrails ***. Patient entered water for aquatic therapy for first time and was introduced to principles and therapeutic effects of water as they ambulated and acclimated to pool.  Aquatic Exercise: Walking forward/backwards/side stepping Ai Chi Postures 1-8 with focus on diaphragmatic breathing Thoracic rotation holding yellow noodle  Gentle cat/cow with yellow noodle Floating with nek doodle, noodle under knees, waist belt with therapist gentle movement  Pt requires the buoyancy of water for active assisted exercises with buoyancy supported for strengthening and AROM exercises. Hydrostatic pressure also supports joints by unweighting joint load by at least 50 % in 3-4 feet depth water. 80% in chest to neck deep water. Water will provide assistance with movement using the  current and laminar flow while the buoyancy reduces weight bearing. Pt requires the viscosity of the water for resistance with strengthening exercises.   [facility] Adult PT Treatment  05/24/2024:  Therapeutic Exercise:  nu-step - not tolerated d/t L leg and L UE pain Gripping putty in supine with hot pack applied  Manual: Not tolerated   Therapeutic Activity  Discussion on relaxation techniques including mindfulness and deep breathing      HOME EXERCISE PROGRAM:  [email or web address]  Treatment priorities   Eval        desensitization        sleep        Stress reduction        aquatics                  ASSESSMENT:  CLINICAL IMPRESSION: ***  06/09/2024: Mikayla Livingston had a difficult time tolerating session d/t pain.  We attempted warm up on nu-step which increased both L UE and L LE pain significantly.  Hot pack applied to pt in supine provided some relief.  Attempted putty squeeze but pt unable d/t pain.  Discussed relaxation techniques but pt is already completing many of these and finds them more beneficial for her mental well being than physical.  Ended session early d/t high pain levels.  Will trial aquatics and reach out to pts MD's about possible referral to pain management.  EVAL:  Mikayla Livingston is a 43 y.o. female who presents to clinic with signs and sxs consistent with chronic neck and L>R UE and LE pain following a fall about 3 years ago.  The pain is variable but often debilitating.  She is no longer able to work or participate in recreation.  Given pain severity it is difficult to accurately assess any specific structure as all movements are provocative.  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   Provided desensitization program and will start with aquatic therapy for entry point into execise/movement.Mikayla Livingston will benefit from skilled PT to address relevant deficits and improve comfort with daily activities like reaching,  lifting, and self care.   OBJECTIVE IMPAIRMENTS: Pain, shoulder ROM, cervical ROM, UE strength  ACTIVITY LIMITATIONS: lifting, carrying, recreation, work, self care, reaching, housework  PERSONAL FACTORS: See medical history and pertinent history   REHAB POTENTIAL: Good  CLINICAL DECISION MAKING: Evolving/moderate complexity  EVALUATION COMPLEXITY: Moderate   GOALS:   SHORT TERM GOALS: Target date: 06/06/2024  Mikayla Livingston will be >75% HEP compliant to improve carryover between sessions and facilitate independent management of condition  Evaluation: ongoing Goal status: INITIAL   LONG TERM GOALS: Target date: 07/04/2024   Mikayla Livingston will self report >/= 50% decrease in pain from evaluation to improve function in daily tasks  Evaluation/Baseline: 10/10 max pain Goal status: INITIAL   2.  Mikayla Livingston will show a >/= 30 pct improvement in her QUICK DASH score (MCID is 10% or ~5 pts) as a proxy for functional improvement   Evaluation/Baseline: QuickDASH Score: 79.5 / 100 = 79.5 % Goal status: INITIAL   3.  Mikayla Livingston will be able to take a shower, not limited by pain  Evaluation/Baseline: limited Goal status: INITIAL   4.  Mikayla Livingston will report confidence in self management of condition at time of discharge with advanced HEP  Evaluation/Baseline: unable to self manage Goal status: INITIAL   5.  Mikayla Livingston will be able to participate in regular exercise as appropriate, not limited by pain  Evaluation/Baseline: limited Goal status: INITIAL    PLAN: PT FREQUENCY: 1-2x/week  PT DURATION: 8 weeks  PLANNED INTERVENTIONS:  97164- PT Re-evaluation, 97110-Therapeutic exercises, 97530- Therapeutic activity, [id number]- Neuromuscular re-education, 97535- Self Care, [id number]- Manual therapy, [id number]- Gait training, [id number]- Aquatic Therapy, [phone]- Electrical stimulation (manual), [id number]- Vasopneumatic device, [id number]- Traction (mechanical), [id number]- Ionotophoresis 4mg /ml Dexamethasone, Taping, Dry Needling, Joint  manipulation, and Spinal manipulation.   Corean Pouch PTA  06/09/2024, 7:28 AM

## 2024-06-16 ENCOUNTER — Ambulatory Visit: Payer: Self-pay

## 2024-06-16 NOTE — Therapy (Incomplete)
 OUTPATIENT PHYSICAL THERAPY TREATMENT NOTE    Patient Name: Mikayla Livingston MRN: [id number] DOB:11-22-1980, 43 y.o., female Today's Date: 06/16/2024   Past Medical History:  Diagnosis Date   Asthma    No past surgical history on file. There are no active problems to display for this patient.   PCP: Mikayla Bleacher, MD  REFERRING PROVIDER: Persons, Ronal Livingston, Mikayla Livingston  THERAPY DIAG:  No diagnosis found.  REFERRING DIAG: Neck pain on left side [M54.2], Cervicalgia [M54.2]   Rationale for Evaluation and Treatment:  Rehabilitation  SUBJECTIVE:  PERTINENT PAST HISTORY:  none        PRECAUTIONS: None  WEIGHT BEARING RESTRICTIONS No  FALLS:  Has patient fallen in last 6 months? No, Number of falls: 0  MOI/History of condition:  Onset date: 3 years  SUBJECTIVE STATEMENT 06/16/2024:  ***  Pt reports that she has been working on the desensitization techniques with some small improvement.  Her pain is still up and down and varies significantly from day to day.    EVAL: Mikayla Livingston is a 43 y.o. female who presents to clinic with chief complaint of chronic L sided neck and arm pain.  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  She has weakness and n/t in her L arm.  She is very sensitive to even light touch.  She has completed PT in the past and this was painful and only minimally helpful.  She is required to do PT before getting an MRI.  She is unable to work d/t the pain.  She was previously very active with tennis and walking/hiking. She participates in some modified yoga.  She also has L sided LE pain.  She is very sensitive to touch in the arm and L LE.  From referring provider:   Plan: Mikayla Livingston is a pleasant 43 year old woman who presents today with a 3-year history of neck pain that radiates down her left arm.  This originally happened when she fell at work.  She has tried medications including  gabapentin and ibuprofen.  She is also tried physical therapy without improvement in her symptoms.  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  She is concerned because she is getting more weakness in the left arm.  She also has a history of low back issues as well from the same fall but we planned on focusing it first on her cervical spine.  She does have decreased grip strength especially.  Decreased sensation distribution in all of her fingers.  She has significant pain with extension of her neck and turning it side-to-side.  Bicep strength is okay.  Will plan on getting an MRI of her cervical spine and have her follow-up with Mikayla Livingston.   Pain:  Are you having pain? Yes Pain location: L sided neck and L UE pain NPRS scale:  BEST: 3/10, Worst: 10/10, Average: 7/10 Aggravating factors: show (washing), dressing, cooking Relieving factors: go to sleep Pain description: sharp and throbbing, shooting, variable  Occupation: caregiver  Assistive Device: NA  Hand Dominance: R  Patient Goals/Specific Activities: reduce pain, improve UE function   OBJECTIVE:   DIAGNOSTIC FINDINGS:  MRI ordered  GENERAL OBSERVATION: Forward head, rounded shoulders     SENSATION: Light touch: N/T fingers of R hand   PALPATION: Exquisite  TTP throughout Cx spine and periscapular musculature, sensitive to light touch throughout L UE.  Cervical ROM  ROM ROM  (Eval)  Flexion 50  Extension 30*  Right lateral flexion 45  Left lateral flexion 40*  Right rotation 60  Left rotation 45*  Flexion rotation (normal is 30 degrees)   Flexion rotation (normal is 30 degrees)     (Blank rows = not tested, N = WNL, * = concordant pain)  UPPER EXTREMITY MMT:  MMT Right (Eval) Left (Eval)  Shoulder flexion 3* 3*  Shoulder abduction (C5)    Shoulder ER 3+* 3*  Shoulder IR    Middle trapezius    Lower trapezius    Shoulder  extension    Grip strength 25 <5 lbs  Shoulder shrug (C4)    Elbow flexion (C6)    Elbow ext (C7)    Thumb ext (C8)    Finger abd (T1)    Grossly     (Blank rows = not tested, score listed is out of 5 possible points.  N = WNL, D = diminished, C = clear for gross weakness with myotome testing, * = concordant pain with testing)  UPPER EXTREMITY AROM:  ROM Right (Eval) Left (Eval)  Shoulder flexion 150 110*  Shoulder abduction    Shoulder internal rotation    Shoulder external rotation    Functional IR    Functional ER    Shoulder extension    Elbow extension    Elbow flexion     (Blank rows = not tested, N = WNL, * = concordant pain with testing)     SPECIAL TESTS:  Spurling's: ~ d/t high pain levels    PATIENT SURVEYS:  Quick dash:     TODAY'S TREATMENT:  [facility] Adult PT Treatment:                                                DATE: 06/09/24 Aquatic therapy at [facility]- [address] - therapeutic pool temp approximately 91 degrees. Pt enters building ***. Treatment took place in water 3.8 to  4 ft 8 in. deep depending upon activity.  Pt entered and exited the pool via stair and handrails ***. Patient entered water for aquatic therapy for first time and was introduced to principles and therapeutic effects of water as they ambulated and acclimated to pool.  Aquatic Exercise: Walking forward/backwards/side stepping Ai Chi Postures 1-8 with focus on diaphragmatic breathing Thoracic rotation holding yellow noodle  Gentle cat/cow with yellow noodle Floating with nek doodle, noodle under knees, waist belt with therapist gentle movement  Pt requires the buoyancy of water for active assisted exercises with buoyancy supported for strengthening and AROM exercises. Hydrostatic pressure also supports joints by unweighting joint load by at least 50 % in 3-4 feet depth water. 80% in chest to neck deep water. Water will provide assistance with movement using the current and  laminar flow while the buoyancy reduces weight bearing. Pt requires the viscosity of the water for resistance with strengthening exercises.   [facility] Adult PT Treatment  05/24/2024:  Therapeutic Exercise:  nu-step - not tolerated d/t L leg and L UE pain Gripping putty in supine with hot pack applied  Manual: Not tolerated   Therapeutic Activity  Discussion on relaxation techniques including mindfulness and deep breathing      HOME EXERCISE PROGRAM:  [email or web address]  Treatment priorities   Eval        desensitization        sleep        Stress reduction        aquatics                  ASSESSMENT:  CLINICAL IMPRESSION: *** Patient presents to first aquatic PT session reporting  06/16/2024: Tian had a difficult time tolerating session d/t pain.  We attempted warm up on nu-step which increased both L UE and L LE pain significantly.  Hot pack applied to pt in supine provided some relief.  Attempted putty squeeze but pt unable d/t pain.  Discussed relaxation techniques but pt is already completing many of these and finds them more beneficial for her mental well being than physical.  Ended session early d/t high pain levels.  Will trial aquatics and reach out to pts MD's about possible referral to pain management.  EVAL:  Kamile is a 43 y.o. female who presents to clinic with signs and sxs consistent with chronic neck and L>R UE and LE pain following a fall about 3 years ago.  The pain is variable but often debilitating.  She is no longer able to work or participate in recreation.  Given pain severity it is difficult to accurately assess any specific structure as all movements are provocative.  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   Provided desensitization program and will start with aquatic therapy for entry point into execise/movement.[name] will benefit from skilled PT to address relevant deficits and improve  comfort with daily activities like reaching, lifting, and self care.   OBJECTIVE IMPAIRMENTS: Pain, shoulder ROM, cervical ROM, UE strength  ACTIVITY LIMITATIONS: lifting, carrying, recreation, work, self care, reaching, housework  PERSONAL FACTORS: See medical history and pertinent history   REHAB POTENTIAL: Good  CLINICAL DECISION MAKING: Evolving/moderate complexity  EVALUATION COMPLEXITY: Moderate   GOALS:   SHORT TERM GOALS: Target date: 06/06/2024  Zyan will be >75% HEP compliant to improve carryover between sessions and facilitate independent management of condition  Evaluation: ongoing Goal status: INITIAL   LONG TERM GOALS: Target date: 07/04/2024   Dorotha will self report >/= 50% decrease in pain from evaluation to improve function in daily tasks  Evaluation/Baseline: 10/10 max pain Goal status: INITIAL   2.  Sundi will show a >/= 30 pct improvement in her QUICK DASH score (MCID is 10% or ~5 pts) as a proxy for functional improvement   Evaluation/Baseline: QuickDASH Score: 79.5 / 100 = 79.5 % Goal status: INITIAL   3.  Kiora will be able to take a shower, not limited by pain  Evaluation/Baseline: limited Goal status: INITIAL   4.  Kauri will report confidence in self management of condition at time of discharge with advanced HEP  Evaluation/Baseline: unable to self manage Goal status: INITIAL   5.  Shaima will be able to participate in regular exercise as appropriate, not limited by pain  Evaluation/Baseline: limited Goal status: INITIAL    PLAN: PT FREQUENCY: 1-2x/week  PT DURATION: 8 weeks  PLANNED INTERVENTIONS:  97164- PT Re-evaluation, 97110-Therapeutic exercises, 97530- Therapeutic activity, [id number]- Neuromuscular re-education, 97535- Self Care, 02859- Manual therapy, [id number]- Gait training, [id number]- Aquatic Therapy, [phone]- Electrical stimulation (manual), [id number]- Vasopneumatic device, [id number]- Traction (mechanical), [id number]- Ionotophoresis 4mg /ml  Dexamethasone, Taping, Dry Needling, Joint manipulation, and Spinal manipulation.   Corean Pouch PTA  06/16/2024, 8:54 AM

## 2024-06-21 ENCOUNTER — Ambulatory Visit

## 2024-06-23 ENCOUNTER — Encounter: Payer: Self-pay | Admitting: Family Medicine

## 2024-06-23 ENCOUNTER — Ambulatory Visit: Attending: Physician Assistant

## 2024-06-23 ENCOUNTER — Ambulatory Visit (INDEPENDENT_AMBULATORY_CARE_PROVIDER_SITE_OTHER): Admitting: Family Medicine

## 2024-06-23 VITALS — BP 116/69 | HR 65 | Ht 64.5 in | Wt 152.2 lb

## 2024-06-23 DIAGNOSIS — F17200 Nicotine dependence, unspecified, uncomplicated: Secondary | ICD-10-CM

## 2024-06-23 DIAGNOSIS — M6281 Muscle weakness (generalized): Secondary | ICD-10-CM | POA: Diagnosis present

## 2024-06-23 DIAGNOSIS — M25511 Pain in right shoulder: Secondary | ICD-10-CM | POA: Insufficient documentation

## 2024-06-23 DIAGNOSIS — R7989 Other specified abnormal findings of blood chemistry: Secondary | ICD-10-CM

## 2024-06-23 DIAGNOSIS — M542 Cervicalgia: Secondary | ICD-10-CM | POA: Diagnosis present

## 2024-06-23 NOTE — Progress Notes (Signed)
 Established Patient Office Visit  Subjective    Patient ID: Elenore Wanninger, female    DOB: 1981/01/08  Age: 43 y.o. MRN: [id number]  CC:  Chief Complaint  Patient presents with   lab results    HPI Meigan Amason presents for follow up of abnormal thyroid labs. Patient denies acute complaints.   Outpatient Encounter Medications as of 06/23/2024  Medication Sig   meloxicam  (MOBIC ) 15 MG tablet Take 1 tablet (15 mg total) by mouth daily. (Patient not taking: Reported on 06/23/2024)   No facility-administered encounter medications on file as of 06/23/2024.    Past Medical History:  Diagnosis Date   Asthma     History reviewed. No pertinent surgical history.  History reviewed. No pertinent family history.  Social History   Socioeconomic History   Marital status: Married    Spouse name: Not on file   Number of children: Not on file   Years of education: Not on file   Highest education level: Not on file  Occupational History   Not on file  Tobacco Use   Smoking status: Every Day    Types: Cigarettes   Smokeless tobacco: Never  Substance and Sexual Activity   Alcohol use: No   Drug use: No   Sexual activity: Never  Other Topics Concern   Not on file  Social History Narrative   Not on file   Social Drivers of Health   Financial Resource Strain: Low Risk  (03/10/2024)   Overall Financial Resource Strain (CARDIA)    Difficulty of Paying Living Expenses: Not very hard  Food Insecurity: No Food Insecurity (03/10/2024)   Hunger Vital Sign    Worried About Running Out of Food in the Last Year: Never true    Ran Out of Food in the Last Year: Never true  Transportation Needs: No Transportation Needs (03/10/2024)   PRAPARE - Administrator, Civil Service (Medical): No    Lack of Transportation (Non-Medical): No  Physical Activity: Inactive (03/10/2024)   Exercise Vital Sign    Days of Exercise per Week: 0 days    Minutes of Exercise per Session: 0 min  Stress:  No Stress Concern Present (03/10/2024)   [employer] of Occupational Health - Occupational Stress Questionnaire    Feeling of Stress : Not at all  Social Connections: Moderately Isolated (03/10/2024)   Social Connection and Isolation Panel    Frequency of Communication with Friends and Family: Three times a week    Frequency of Social Gatherings with Friends and Family: Three times a week    Attends Religious Services: 1 to 4 times per year    Active Member of Clubs or Organizations: No    Attends Banker Meetings: Never    Marital Status: Never married  Intimate Partner Violence: Not At Risk (03/10/2024)   Humiliation, Afraid, Rape, and Kick questionnaire    Fear of Current or Ex-Partner: No    Emotionally Abused: No    Physically Abused: No    Sexually Abused: No    Review of Systems  All other systems reviewed and are negative.       Objective    BP 116/69   Pulse 65   Ht 5' 4.5 (1.638 m)   Wt 152 lb 3.2 oz (69 kg)   SpO2 97%   BMI 25.72 kg/m   Physical Exam Vitals and nursing note reviewed.  Constitutional:      General: She is not in acute distress.  Neck:     Thyroid: No thyromegaly.  Cardiovascular:     Rate and Rhythm: Normal rate and regular rhythm.  Pulmonary:     Effort: Pulmonary effort is normal.     Breath sounds: Normal breath sounds.  Abdominal:     Palpations: Abdomen is soft.     Tenderness: There is no abdominal tenderness.  Musculoskeletal:     Cervical back: Normal range of motion and neck supple.  Neurological:     General: No focal deficit present.     Mental Status: She is alert and oriented to person, place, and time.         Assessment & Plan:   1. Abnormal TSH (Primary)  - TSH + free T4  2. Smoker Discussed reduction/cessation    No follow-ups on file.   Tanda Raguel SQUIBB, MD

## 2024-06-23 NOTE — Therapy (Signed)
 OUTPATIENT PHYSICAL THERAPY TREATMENT NOTE    Patient Name: [name] MRN: [id number] DOB:11-08-1980, 43 y.o., female Today's Date: 06/23/2024   PT End of Session - 06/23/24 1411     Visit Number 3    Date for PT Re-Evaluation 07/04/24    Authorization Type [employer]    PT Start Time 1410    PT Stop Time 1450    PT Time Calculation (min) 40 min    Activity Tolerance Patient tolerated treatment well;Patient limited by pain    Behavior During Therapy [facility] for tasks assessed/performed         Past Medical History:  Diagnosis Date   Asthma    History reviewed. No pertinent surgical history. There are no active problems to display for this patient.   PCP: Tanda Bleacher, MD  REFERRING PROVIDER: Persons, Ronal Dragon, [place]  THERAPY DIAG:  Cervicalgia  Right shoulder pain, unspecified chronicity  Muscle weakness  REFERRING DIAG: Neck pain on left side [M54.2], Cervicalgia [M54.2]   Rationale for Evaluation and Treatment:  Rehabilitation  SUBJECTIVE:  PERTINENT PAST HISTORY:  none        PRECAUTIONS: None  WEIGHT BEARING RESTRICTIONS No  FALLS:  Has patient fallen in last 6 months? No, Number of falls: 0  MOI/History of condition:  Onset date: 3 years  SUBJECTIVE STATEMENT 06/23/2024:  Patient reports that it is mostly her foot/heel that is bothering her today, rates the pain as a 6/10, other areas are feeling ok.   EVAL: Mikayla Livingston is a 43 y.o. female who presents to clinic with chief complaint of chronic L sided neck and arm pain.  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  She has weakness and n/t in her L arm.  She is very sensitive to even light touch.  She has completed PT in the past and this was painful and only minimally helpful.  She is required to do PT before getting an MRI.  She is unable to work d/t the pain.  She was previously very active with tennis and  walking/hiking. She participates in some modified yoga.  She also has L sided LE pain.  She is very sensitive to touch in the arm and L LE.  From referring provider:   Plan: Mikayla Livingston is a pleasant 44 year old woman who presents today with a 3-year history of neck pain that radiates down her left arm.  This originally happened when she fell at work.  She has tried medications including gabapentin and ibuprofen.  She is also tried physical therapy without improvement in her symptoms.  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  She is concerned because she is getting more weakness in the left arm.  She also has a history of low back issues as well from the same fall but we planned on focusing it first on her cervical spine.  She does have decreased grip strength especially.  Decreased sensation distribution in all of her fingers.  She has significant pain with extension of her neck and turning it side-to-side.  Bicep strength is okay.  Will plan on getting an MRI of her cervical spine and have her follow-up with Megan.   Pain:  Are you having pain? Yes Pain location: L sided neck and L UE pain NPRS  scale:  BEST: 3/10, Worst: 10/10, Average: 7/10 Aggravating factors: show (washing), dressing, cooking Relieving factors: go to sleep Pain description: sharp and throbbing, shooting, variable  Occupation: caregiver  Assistive Device: NA  Hand Dominance: R  Patient Goals/Specific Activities: reduce pain, improve UE function   OBJECTIVE:   DIAGNOSTIC FINDINGS:  MRI ordered  GENERAL OBSERVATION: Forward head, rounded shoulders     SENSATION: Light touch: N/T fingers of R hand   PALPATION: Exquisite TTP throughout Cx spine and periscapular musculature, sensitive to light touch throughout L UE.  Cervical ROM  ROM ROM  (Eval)  Flexion 50  Extension 30*  Right lateral flexion 45  Left lateral flexion  40*  Right rotation 60  Left rotation 45*  Flexion rotation (normal is 30 degrees)   Flexion rotation (normal is 30 degrees)     (Blank rows = not tested, N = WNL, * = concordant pain)  UPPER EXTREMITY MMT:  MMT Right (Eval) Left (Eval)  Shoulder flexion 3* 3*  Shoulder abduction (C5)    Shoulder ER 3+* 3*  Shoulder IR    Middle trapezius    Lower trapezius    Shoulder extension    Grip strength 25 <5 lbs  Shoulder shrug (C4)    Elbow flexion (C6)    Elbow ext (C7)    Thumb ext (C8)    Finger abd (T1)    Grossly     (Blank rows = not tested, score listed is out of 5 possible points.  N = WNL, D = diminished, C = clear for gross weakness with myotome testing, * = concordant pain with testing)  UPPER EXTREMITY AROM:  ROM Right (Eval) Left (Eval)  Shoulder flexion 150 110*  Shoulder abduction    Shoulder internal rotation    Shoulder external rotation    Functional IR    Functional ER    Shoulder extension    Elbow extension    Elbow flexion     (Blank rows = not tested, N = WNL, * = concordant pain with testing)   SPECIAL TESTS:  Spurling's: ~ d/t high pain levels    PATIENT SURVEYS:  Quick dash:     TODAY'S TREATMENT:  [facility] Adult PT Treatment:                                                DATE: 06/23/24 Aquatic therapy at [facility]- [address] - therapeutic pool temp approximately 91 degrees. Pt enters building ambulating independently. Treatment took place in water 3.8 to 4 ft 8 in. deep depending upon activity.  Pt entered and exited the pool via stair and handrails independently. Patient entered water for aquatic therapy for first time and was introduced to principles and therapeutic effects of water as they ambulated and acclimated to pool.  Aquatic Exercise: Walking forward/backwards/side stepping x2 laps each Calf stretch LLE - holding onto pool wall (x30 pain) Small range heel raise x10 (pain shooting from foot up leg) Ai Chi Postures  1-3 with focus on diaphragmatic breathing - back against pool wall to be able to take pressure off LLE (burning feeling in LLE) Thoracic rotation holding blue noodle x1' Gentle cat/cow with blue noodle x10  Pt requires the buoyancy of water for active assisted exercises with buoyancy supported for strengthening and AROM exercises. Hydrostatic pressure also supports joints by unweighting joint  load by at least 50 % in 3-4 feet depth water. 80% in chest to neck deep water. Water will provide assistance with movement using the current and laminar flow while the buoyancy reduces weight bearing. Pt requires the viscosity of the water for resistance with strengthening exercises.   [facility] Adult PT Treatment  05/24/2024:  Therapeutic Exercise:  nu-step - not tolerated d/t L leg and L UE pain Gripping putty in supine with hot pack applied  Manual: Not tolerated   Therapeutic Activity  Discussion on relaxation techniques including mindfulness and deep breathing      HOME EXERCISE PROGRAM:  [email or web address]  Treatment priorities   Eval        desensitization        sleep        Stress reduction        aquatics                  ASSESSMENT:  CLINICAL IMPRESSION: Patient presents to first aquatic PT session reporting overall improvements in her pain since last session, is mostly having pain in her left heel today. She has been compliant with her desensitization techniques at home and notices small improvements. Session today focused on gentle movement and improving overall activity tolerance in the aquatic environment for use of buoyancy to offload joints and the viscosity of water as resistance during therapeutic exercise. Patient was able to tolerate all prescribed exercises in the aquatic environment with no adverse effects, though she is somewhat limited by pain during session in her L foot and L forearm. Patient continues to benefit from  skilled PT services on land and aquatic based and should be progressed as able to improve functional independence.   EVAL:  Mikayla Livingston is a 43 y.o. female who presents to clinic with signs and sxs consistent with chronic neck and L>R UE and LE pain following a fall about 3 years ago.  The pain is variable but often debilitating.  She is no longer able to work or participate in recreation.  Given pain severity it is difficult to accurately assess any specific structure as all movements are provocative.  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   Provided desensitization program and will start with aquatic therapy for entry point into execise/movement.[name] will benefit from skilled PT to address relevant deficits and improve comfort with daily activities like reaching, lifting, and self care.   OBJECTIVE IMPAIRMENTS: Pain, shoulder ROM, cervical ROM, UE strength  ACTIVITY LIMITATIONS: lifting, carrying, recreation, work, self care, reaching, housework  PERSONAL FACTORS: See medical history and pertinent history   REHAB POTENTIAL: Good  CLINICAL DECISION MAKING: Evolving/moderate complexity  EVALUATION COMPLEXITY: Moderate   GOALS:   SHORT TERM GOALS: Target date: 06/06/2024  Nile will be >75% HEP compliant to improve carryover between sessions and facilitate independent management of condition  Evaluation: ongoing Goal status: INITIAL   LONG TERM GOALS: Target date: 07/04/2024   Dalary will self report >/= 50% decrease in pain from evaluation to improve function in daily tasks  Evaluation/Baseline: 10/10 max pain Goal status: INITIAL   2.  Jasiah will show a >/= 30 pct improvement in her QUICK DASH score (MCID is 10% or ~5 pts) as a proxy for functional improvement   Evaluation/Baseline: QuickDASH Score: 79.5 / 100 = 79.5 % Goal status: INITIAL   3.  Bellanie will be able to take a shower, not limited by pain  Evaluation/Baseline: limited Goal status: INITIAL   4.  Daneesha will report confidence in self management of condition at time of discharge with advanced HEP  Evaluation/Baseline: unable to self manage Goal status: INITIAL   5.  Karis will be able to participate in regular exercise as appropriate, not limited by pain  Evaluation/Baseline: limited Goal status: INITIAL    PLAN: PT FREQUENCY: 1-2x/week  PT DURATION: 8 weeks  PLANNED INTERVENTIONS:  97164- PT Re-evaluation, 97110-Therapeutic exercises, 97530- Therapeutic activity, [id number]- Neuromuscular re-education, 97535- Self Care, [id number]- Manual therapy, [id number]- Gait training, [id number]- Aquatic Therapy, [phone]- Electrical stimulation (manual), [id number]- Vasopneumatic device, [id number]- Traction (mechanical), [id number]- Ionotophoresis 4mg /ml Dexamethasone, Taping, Dry Needling, Joint manipulation, and Spinal manipulation.   Corean Pouch PTA  06/23/2024, 2:59 PM

## 2024-06-24 ENCOUNTER — Ambulatory Visit: Payer: Self-pay | Admitting: Family Medicine

## 2024-06-24 LAB — TSH+FREE T4
Free T4: 1.3 ng/dL (ref 0.82–1.77)
TSH: 0.274 u[IU]/mL — ABNORMAL LOW (ref 0.450–4.500)

## 2024-06-24 NOTE — Progress Notes (Signed)
 Tried to call pt to advise and schedule follow up. No answer, LVM for pt to call back

## 2024-06-27 ENCOUNTER — Encounter: Payer: Self-pay | Admitting: Family Medicine

## 2024-06-30 ENCOUNTER — Ambulatory Visit: Payer: Self-pay

## 2024-06-30 ENCOUNTER — Ambulatory Visit (INDEPENDENT_AMBULATORY_CARE_PROVIDER_SITE_OTHER): Admitting: Family

## 2024-06-30 VITALS — BP 103/69 | HR 72 | Temp 98.2°F | Resp 16 | Ht 64.54 in | Wt 153.0 lb

## 2024-06-30 DIAGNOSIS — M6281 Muscle weakness (generalized): Secondary | ICD-10-CM

## 2024-06-30 DIAGNOSIS — M542 Cervicalgia: Secondary | ICD-10-CM

## 2024-06-30 DIAGNOSIS — M25511 Pain in right shoulder: Secondary | ICD-10-CM

## 2024-06-30 DIAGNOSIS — E038 Other specified hypothyroidism: Secondary | ICD-10-CM

## 2024-06-30 MED ORDER — LEVOTHYROXINE SODIUM 25 MCG PO TABS: 25.0000 ug | ORAL_TABLET | Freq: Every day | ORAL | 0 refills | Status: AC

## 2024-06-30 NOTE — Therapy (Signed)
 OUTPATIENT PHYSICAL THERAPY TREATMENT NOTE    Patient Name: Mikayla Livingston MRN: [id number] DOB:1980/11/17, 43 y.o., female Today's Date: 06/30/2024   PT End of Session - 06/30/24 1405     Visit Number 4    Date for Recertification  07/04/24    Authorization Type [employer]    PT Start Time 1404    PT Stop Time 1430    PT Time Calculation (min) 26 min    Activity Tolerance Patient limited by pain    Behavior During Therapy [facility] for tasks assessed/performed          Past Medical History:  Diagnosis Date   Asthma    History reviewed. No pertinent surgical history. There are no active problems to display for this patient.   PCP: Tanda Bleacher, MD  REFERRING PROVIDER: Tanda Bleacher, MD  THERAPY DIAG:  Cervicalgia  Right shoulder pain, unspecified chronicity  Muscle weakness  REFERRING DIAG: Neck pain on left side [M54.2], Cervicalgia [M54.2]   Rationale for Evaluation and Treatment:  Rehabilitation  SUBJECTIVE:  PERTINENT PAST HISTORY:  none        PRECAUTIONS: None  WEIGHT BEARING RESTRICTIONS No  FALLS:  Has patient fallen in last 6 months? No, Number of falls: 0  MOI/History of condition:  Onset date: 3 years  SUBJECTIVE STATEMENT 06/30/2024:  Patient reports that she is in a lot of pain today, it started in her leg and is also in her arm today.    EVAL: Mikayla Livingston is a 43 y.o. female who presents to clinic with chief complaint of chronic L sided neck and arm pain.  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  She has weakness and n/t in her L arm.  She is very sensitive to even light touch.  She has completed PT in the past and this was painful and only minimally helpful.  She is required to do PT before getting an MRI.  She is unable to work d/t the pain.  She was previously very active with tennis and walking/hiking. She participates in some modified yoga.  She also has  L sided LE pain.  She is very sensitive to touch in the arm and L LE.  From referring provider:   Plan: Mikayla Livingston is a pleasant 43 year old woman who presents today with a 3-year history of neck pain that radiates down her left arm.  This originally happened when she fell at work.  She has tried medications including gabapentin and ibuprofen.  She is also tried physical therapy without improvement in her symptoms.  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  She is concerned because she is getting more weakness in the left arm.  She also has a history of low back issues as well from the same fall but we planned on focusing it first on her cervical spine.  She does have decreased grip strength especially.  Decreased sensation distribution in all of her fingers.  She has significant pain with extension of her neck and turning it side-to-side.  Bicep strength is okay.  Will plan on getting an MRI of her cervical spine and have her follow-up with Megan.   Pain:  Are you having pain? Yes Pain location: L sided neck and L UE pain NPRS scale:  BEST:  3/10, Worst: 10/10, Average: 7/10 Aggravating factors: show (washing), dressing, cooking Relieving factors: go to sleep Pain description: sharp and throbbing, shooting, variable  Occupation: caregiver  Assistive Device: NA  Hand Dominance: R  Patient Goals/Specific Activities: reduce pain, improve UE function   OBJECTIVE:   DIAGNOSTIC FINDINGS:  MRI ordered  GENERAL OBSERVATION: Forward head, rounded shoulders     SENSATION: Light touch: N/T fingers of R hand   PALPATION: Exquisite TTP throughout Cx spine and periscapular musculature, sensitive to light touch throughout L UE.  Cervical ROM  ROM ROM  (Eval)  Flexion 50  Extension 30*  Right lateral flexion 45  Left lateral flexion 40*  Right rotation 60  Left rotation 45*  Flexion rotation (normal  is 30 degrees)   Flexion rotation (normal is 30 degrees)     (Blank rows = not tested, N = WNL, * = concordant pain)  UPPER EXTREMITY MMT:  MMT Right (Eval) Left (Eval)  Shoulder flexion 3* 3*  Shoulder abduction (C5)    Shoulder ER 3+* 3*  Shoulder IR    Middle trapezius    Lower trapezius    Shoulder extension    Grip strength 25 <5 lbs  Shoulder shrug (C4)    Elbow flexion (C6)    Elbow ext (C7)    Thumb ext (C8)    Finger abd (T1)    Grossly     (Blank rows = not tested, score listed is out of 5 possible points.  N = WNL, D = diminished, C = clear for gross weakness with myotome testing, * = concordant pain with testing)  UPPER EXTREMITY AROM:  ROM Right (Eval) Left (Eval)  Shoulder flexion 150 110*  Shoulder abduction    Shoulder internal rotation    Shoulder external rotation    Functional IR    Functional ER    Shoulder extension    Elbow extension    Elbow flexion     (Blank rows = not tested, N = WNL, * = concordant pain with testing)   SPECIAL TESTS:  Spurling's: ~ d/t high pain levels    PATIENT SURVEYS:  Quick dash:     TODAY'S TREATMENT:  [facility] Adult PT Treatment:                                                DATE: 06/30/24 Aquatic therapy at [facility]- [address] - therapeutic pool temp approximately 91 degrees. Pt enters building ambulating independently. Treatment took place in water 3.8 to 4 ft 8 in. deep depending upon activity.  Pt entered and exited the pool via stair and handrails independently.  Aquatic Exercise: Walking forwards x1 lap (painful in LLE) Sitting on submerged bench: Alternating LAQ x3' Bicycle kick x2' Scissor kicks x1'  Pt requires the buoyancy of water for active assisted exercises with buoyancy supported for strengthening and AROM exercises. Hydrostatic pressure also supports joints by unweighting joint load by at least 50 % in 3-4 feet depth water. 80% in chest to neck deep water. Water will provide  assistance with movement using the current and laminar flow while the buoyancy reduces weight bearing. Pt requires the viscosity of the water for resistance with strengthening exercises.  [facility]:  DATE: 06/23/24 Aquatic therapy at [facility]- [address] - therapeutic pool temp approximately 91 degrees. Pt enters building ambulating independently. Treatment took place in water 3.8 to 4 ft 8 in. deep depending upon activity.  Pt entered and exited the pool via stair and handrails independently. Patient entered water for aquatic therapy for first time and was introduced to principles and therapeutic effects of water as they ambulated and acclimated to pool.  Aquatic Exercise: Walking forward/backwards/side stepping x2 laps each Calf stretch LLE - holding onto pool wall (x30 pain) Small range heel raise x10 (pain shooting from foot up leg) Ai Chi Postures 1-3 with focus on diaphragmatic breathing - back against pool wall to be able to take pressure off LLE (burning feeling in LLE) Thoracic rotation holding blue noodle x1' Gentle cat/cow with blue noodle x10  Pt requires the buoyancy of water for active assisted exercises with buoyancy supported for strengthening and AROM exercises. Hydrostatic pressure also supports joints by unweighting joint load by at least 50 % in 3-4 feet depth water. 80% in chest to neck deep water. Water will provide assistance with movement using the current and laminar flow while the buoyancy reduces weight bearing. Pt requires the viscosity of the water for resistance with strengthening exercises.   [facility] Adult PT Treatment  05/24/2024:  Therapeutic Exercise:  nu-step - not tolerated d/t L leg and L UE pain Gripping putty in supine with hot pack applied  Manual: Not tolerated   Therapeutic Activity  Discussion on relaxation techniques including mindfulness and deep breathing      HOME  EXERCISE PROGRAM:  [email or web address]  Treatment priorities   Eval        desensitization        sleep        Stress reduction        aquatics                  ASSESSMENT:  CLINICAL IMPRESSION: Patient presents to aquatic PT session reporting increased pain in her LUE and LLE today, especially her L knee, foot, and wrist/hand. She considered cancelling session, but wanted to try it out. Session today focused on gentle movement, breathing, and focus on decreasing sympathetic nervous system involvement in the aquatic environment for use of buoyancy to offload joints and the viscosity of water as resistance during therapeutic exercise. She is very limited by pain today, needing to terminate session early. Patient continues to benefit from skilled PT services on land and aquatic based and should be progressed as able to improve functional independence.   EVAL:  Cher is a 43 y.o. female who presents to clinic with signs and sxs consistent with chronic neck and L>R UE and LE pain following a fall about 3 years ago.  The pain is variable but often debilitating.  She is no longer able to work or participate in recreation.  Given pain severity it is difficult to accurately assess any specific structure as all movements are provocative.  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   Provided desensitization program and will start with aquatic therapy for entry point into execise/movement.[name] will benefit from skilled PT to address relevant deficits and improve comfort with daily activities like reaching, lifting, and self care.   OBJECTIVE IMPAIRMENTS: Pain, shoulder ROM, cervical ROM, UE strength  ACTIVITY LIMITATIONS: lifting, carrying, recreation, work, self care, reaching, housework  PERSONAL FACTORS: See medical history and pertinent history   REHAB POTENTIAL: Good  CLINICAL  DECISION MAKING: Evolving/moderate  complexity  EVALUATION COMPLEXITY: Moderate   GOALS:   SHORT TERM GOALS: Target date: 06/06/2024  Honi will be >75% HEP compliant to improve carryover between sessions and facilitate independent management of condition  Evaluation: ongoing Goal status: INITIAL   LONG TERM GOALS: Target date: 07/04/2024   Renessa will self report >/= 50% decrease in pain from evaluation to improve function in daily tasks  Evaluation/Baseline: 10/10 max pain Goal status: INITIAL   2.  Kattia will show a >/= 30 pct improvement in her QUICK DASH score (MCID is 10% or ~5 pts) as a proxy for functional improvement   Evaluation/Baseline: QuickDASH Score: 79.5 / 100 = 79.5 % Goal status: INITIAL   3.  Waneta will be able to take a shower, not limited by pain  Evaluation/Baseline: limited Goal status: INITIAL   4.  Jeriyah will report confidence in self management of condition at time of discharge with advanced HEP  Evaluation/Baseline: unable to self manage Goal status: INITIAL   5.  Ladelle will be able to participate in regular exercise as appropriate, not limited by pain  Evaluation/Baseline: limited Goal status: INITIAL    PLAN: PT FREQUENCY: 1-2x/week  PT DURATION: 8 weeks  PLANNED INTERVENTIONS:  97164- PT Re-evaluation, 97110-Therapeutic exercises, 97530- Therapeutic activity, [id number]- Neuromuscular re-education, 97535- Self Care, [id number]- Manual therapy, [id number]- Gait training, [id number]- Aquatic Therapy, [phone]- Electrical stimulation (manual), [id number]- Vasopneumatic device, [id number]- Traction (mechanical), [id number]- Ionotophoresis 4mg /ml Dexamethasone, Taping, Dry Needling, Joint manipulation, and Spinal manipulation.   Corean Pouch PTA  06/30/2024, 2:35 PM

## 2024-06-30 NOTE — Progress Notes (Signed)
 Patient ID: Mikayla Livingston, female    DOB: [birth year]  MRN: [id number]  CC: Follow-Up  Subjective: Mikayla Livingston is a 43 y.o. female who presents for follow-up. She is accompanied by her fiance.   Her concerns today include:  States she received a phone call to take thyroid medication (Synthroid ). States she never began taking Synthroid  because she's not going to take medication that she doesn't know anything about and doesn't know why she is taking it. Patient and patient's fiance have several questions about Synthroid  and subclinical hypothyroidism. Patient also has questions about labs results from August 2025. Upon chart review there are no lab results from August 2025.  There are no active problems to display for this patient.    Current Outpatient Medications on File Prior to Visit  Medication Sig Dispense Refill   meloxicam  (MOBIC ) 15 MG tablet Take 1 tablet (15 mg total) by mouth daily. (Patient not taking: Reported on 06/23/2024) 30 tablet 0   No current facility-administered medications on file prior to visit.    Allergies  Allergen Reactions   Oxycodone Itching and Rash   Penicillins Hives    Has patient had a PCN reaction causing immediate rash, facial/tongue/throat swelling, SOB or lightheadedness with hypotension: Yes Has patient had a PCN reaction causing severe rash involving mucus membranes or skin necrosis: No Has patient had a PCN reaction that required hospitalization No Has patient had a PCN reaction occurring within the last 10 years: Yes If all of the above answers are NO, then may proceed with Cephalosporin use.    Shellfish Allergy     Social History   Socioeconomic History   Marital status: Married    Spouse name: Not on file   Number of children: Not on file   Years of education: Not on file   Highest education level: Not on file  Occupational History   Not on file  Tobacco Use   Smoking status: Every Day    Types: Cigarettes   Smokeless  tobacco: Never  Substance and Sexual Activity   Alcohol use: No   Drug use: No   Sexual activity: Never  Other Topics Concern   Not on file  Social History Narrative   Not on file   Social Drivers of Health   Financial Resource Strain: Low Risk  (03/10/2024)   Overall Financial Resource Strain (CARDIA)    Difficulty of Paying Living Expenses: Not very hard  Food Insecurity: No Food Insecurity (03/10/2024)   Hunger Vital Sign    Worried About Running Out of Food in the Last Year: Never true    Ran Out of Food in the Last Year: Never true  Transportation Needs: No Transportation Needs (03/10/2024)   PRAPARE - Administrator, Civil Service (Medical): No    Lack of Transportation (Non-Medical): No  Physical Activity: Inactive (03/10/2024)   Exercise Vital Sign    Days of Exercise per Week: 0 days    Minutes of Exercise per Session: 0 min  Stress: No Stress Concern Present (03/10/2024)   [employer] of Occupational Health - Occupational Stress Questionnaire    Feeling of Stress : Not at all  Social Connections: Moderately Isolated (03/10/2024)   Social Connection and Isolation Panel    Frequency of Communication with Friends and Family: Three times a week    Frequency of Social Gatherings with Friends and Family: Three times a week    Attends Religious Services: 1 to 4 times per year  Active Member of Clubs or Organizations: No    Attends Banker Meetings: Never    Marital Status: Never married  Intimate Partner Violence: Not At Risk (03/10/2024)   Humiliation, Afraid, Rape, and Kick questionnaire    Fear of Current or Ex-Partner: No    Emotionally Abused: No    Physically Abused: No    Sexually Abused: No    No family history on file.  No past surgical history on file.  ROS: Review of Systems Negative except as stated above  PHYSICAL EXAM: BP 103/69   Pulse 72   Temp 98.2 F (36.8 C) (Oral)   Resp 16   Ht 5' 4.54 (1.639 m)   Wt 153 lb  (69.4 kg)   SpO2 96%   BMI 25.82 kg/m   Physical Exam HENT:     Head: Normocephalic and atraumatic.     Nose: Nose normal.     Mouth/Throat:     Mouth: Mucous membranes are moist.     Pharynx: Oropharynx is clear.  Eyes:     Extraocular Movements: Extraocular movements intact.     Conjunctiva/sclera: Conjunctivae normal.     Pupils: Pupils are equal, round, and reactive to light.  Cardiovascular:     Rate and Rhythm: Normal rate and regular rhythm.     Pulses: Normal pulses.     Heart sounds: Normal heart sounds.  Pulmonary:     Effort: Pulmonary effort is normal.     Breath sounds: Normal breath sounds.  Musculoskeletal:        General: Normal range of motion.     Cervical back: Normal range of motion and neck supple.  Neurological:     General: No focal deficit present.     Mental Status: She is alert and oriented to person, place, and time.  Psychiatric:        Mood and Affect: Mood normal.        Behavior: Behavior normal.     ASSESSMENT AND PLAN: 1. Subclinical hypothyroidism (Primary) - Levothyroxine  as prescribed. Counseled on medication adherence/adverse effects.  - All questions from patient and patient's fiance answered. Patient and patient's fiance verbalized understanding.  - Follow-up with primary provider in 6 weeks or sooner if needed.  - levothyroxine  (SYNTHROID ) 25 MCG tablet; Take 1 tablet (25 mcg total) by mouth daily.  Dispense: 90 tablet; Refill: 0   Patient was given the opportunity to ask questions.  Patient verbalized understanding of the plan and was able to repeat key elements of the plan. Patient was given clear instructions to go to Emergency Department or return to medical center if symptoms don't improve, worsen, or new problems develop.The patient verbalized understanding.  Requested Prescriptions   Signed Prescriptions Disp Refills   levothyroxine  (SYNTHROID ) 25 MCG tablet 90 tablet 0    Sig: Take 1 tablet (25 mcg total) by mouth daily.     Return in 6 weeks (on 08/11/2024) for Follow-Up or next available chronic conditions with Raguel Blush, MD.  Greig JINNY Drones, NP

## 2024-07-04 ENCOUNTER — Other Ambulatory Visit: Payer: Self-pay | Admitting: Family Medicine

## 2024-07-04 ENCOUNTER — Encounter: Payer: Self-pay | Admitting: Family Medicine

## 2024-07-04 DIAGNOSIS — E038 Other specified hypothyroidism: Secondary | ICD-10-CM

## 2024-07-13 ENCOUNTER — Ambulatory Visit: Admitting: Family Medicine

## 2024-07-28 ENCOUNTER — Encounter: Payer: Self-pay | Admitting: Physical Therapy

## 2024-07-28 ENCOUNTER — Ambulatory Visit: Attending: Physician Assistant | Admitting: Physical Therapy

## 2024-07-28 ENCOUNTER — Other Ambulatory Visit: Payer: Self-pay

## 2024-07-28 DIAGNOSIS — M6281 Muscle weakness (generalized): Secondary | ICD-10-CM | POA: Diagnosis present

## 2024-07-28 DIAGNOSIS — M542 Cervicalgia: Secondary | ICD-10-CM

## 2024-07-28 DIAGNOSIS — M25511 Pain in right shoulder: Secondary | ICD-10-CM | POA: Insufficient documentation

## 2024-07-28 NOTE — Therapy (Signed)
 OUTPATIENT PHYSICAL THERAPY TREATMENT NOTE    Patient Name: Mikayla Livingston MRN: [id number] DOB:September 24, 1981, 43 y.o., female Today's Date: 07/28/2024   PT End of Session - 07/28/24 1418     Visit Number 5    Date for Recertification  07/04/24    Authorization Type [employer]    PT Start Time 1417    PT Stop Time 1432    PT Time Calculation (min) 15 min    Activity Tolerance Patient limited by pain    Behavior During Therapy [facility] for tasks assessed/performed          Past Medical History:  Diagnosis Date   Asthma    History reviewed. No pertinent surgical history. There are no active problems to display for this patient.   PCP: Tanda Bleacher, MD  REFERRING PROVIDER: Persons, Ronal Dragon, [place]  THERAPY DIAG:  Cervicalgia - Plan: PT plan of care cert/re-cert  Right shoulder pain, unspecified chronicity - Plan: PT plan of care cert/re-cert  Muscle weakness - Plan: PT plan of care cert/re-cert  REFERRING DIAG: Neck pain on left side [M54.2], Cervicalgia [M54.2]   Rationale for Evaluation and Treatment:  Rehabilitation  SUBJECTIVE:  PERTINENT PAST HISTORY:  none        PRECAUTIONS: None  WEIGHT BEARING RESTRICTIONS No  FALLS:  Has patient fallen in last 6 months? No, Number of falls: 0  MOI/History of condition:  Onset date: 3 years  SUBJECTIVE STATEMENT 07/28/2024:  Pt reports that she has been in increasing pain.  She is getting progressive pain in her L LE as well and having difficulty walking.  She was unable to tolerate pool exercise and has any touch or movement has made her pain considerably worse.  Desensitization has not been effective.   EVAL: Mikayla Livingston is a 43 y.o. female who presents to clinic with chief complaint of chronic L sided neck and arm pain.  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  She has weakness and n/t in her L arm.  She is very sensitive to even  light touch.  She has completed PT in the past and this was painful and only minimally helpful.  She is required to do PT before getting an MRI.  She is unable to work d/t the pain.  She was previously very active with tennis and walking/hiking. She participates in some modified yoga.  She also has L sided LE pain.  She is very sensitive to touch in the arm and L LE.  From referring provider:   Plan: Mikayla Livingston is a pleasant 43 year old woman who presents today with a 3-year history of neck pain that radiates down her left arm.  This originally happened when she fell at work.  She has tried medications including gabapentin and ibuprofen.  She is also tried physical therapy without improvement in her symptoms.  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  She is concerned because she is getting more weakness in the left arm.  She also has a history of low back issues as well from the same fall but we planned on focusing it first on her cervical spine.  She does have decreased grip strength especially.  Decreased sensation distribution in all of her fingers.  She has significant pain with extension of her  neck and turning it side-to-side.  Bicep strength is okay.  Will plan on getting an MRI of her cervical spine and have her follow-up with Mikayla Livingston.   Pain:  Are you having pain? Yes Pain location: L sided neck and L UE pain NPRS scale:  BEST: 3/10, Worst: 10/10, Average: 7/10 Aggravating factors: show (washing), dressing, cooking Relieving factors: go to sleep Pain description: sharp and throbbing, shooting, variable  Occupation: caregiver  Assistive Device: NA  Hand Dominance: R  Patient Goals/Specific Activities: reduce pain, improve UE function   OBJECTIVE:   DIAGNOSTIC FINDINGS:  MRI ordered  GENERAL OBSERVATION: Forward head, rounded shoulders     SENSATION: Light touch: N/T fingers of R  hand   PALPATION: Exquisite TTP throughout Cx spine and periscapular musculature, sensitive to light touch throughout L UE.  Cervical ROM  ROM ROM  (Eval)  Flexion 50  Extension 30*  Right lateral flexion 45  Left lateral flexion 40*  Right rotation 60  Left rotation 45*  Flexion rotation (normal is 30 degrees)   Flexion rotation (normal is 30 degrees)     (Blank rows = not tested, N = WNL, * = concordant pain)  UPPER EXTREMITY MMT:  MMT Right (Eval) Left (Eval)  Shoulder flexion 3* 3*  Shoulder abduction (C5)    Shoulder ER 3+* 3*  Shoulder IR    Middle trapezius    Lower trapezius    Shoulder extension    Grip strength 25 <5 lbs  Shoulder shrug (C4)    Elbow flexion (C6)    Elbow ext (C7)    Thumb ext (C8)    Finger abd (T1)    Grossly     (Blank rows = not tested, score listed is out of 5 possible points.  N = WNL, D = diminished, C = clear for gross weakness with myotome testing, * = concordant pain with testing)  UPPER EXTREMITY AROM:  ROM Right (Eval) Left (Eval)  Shoulder flexion 150 110*  Shoulder abduction    Shoulder internal rotation    Shoulder external rotation    Functional IR    Functional ER    Shoulder extension    Elbow extension    Elbow flexion     (Blank rows = not tested, N = WNL, * = concordant pain with testing)   SPECIAL TESTS:  Spurling's: ~ d/t high pain levels    PATIENT SURVEYS:  Quick dash:     TODAY'S TREATMENT:  [facility] Adult PT Treatment:                                                DATE: 07/28/24  Therapeutic Activity  collecting information for goals, checking progress, and reviewing with patient D/C planning  [facility] Adult PT Treatment:                                                DATE: 06/23/24 Aquatic therapy at [facility]- [address] - therapeutic pool temp approximately 91 degrees. Pt enters building ambulating independently. Treatment took place in water 3.8 to 4 ft 8 in. deep depending upon  activity.  Pt entered and exited the pool via stair and handrails independently. Patient entered water for aquatic  therapy for first time and was introduced to principles and therapeutic effects of water as they ambulated and acclimated to pool.  Aquatic Exercise: Walking forward/backwards/side stepping x2 laps each Calf stretch LLE - holding onto pool wall (x30 pain) Small range heel raise x10 (pain shooting from foot up leg) Ai Chi Postures 1-3 with focus on diaphragmatic breathing - back against pool wall to be able to take pressure off LLE (burning feeling in LLE) Thoracic rotation holding blue noodle x1' Gentle cat/cow with blue noodle x10  Pt requires the buoyancy of water for active assisted exercises with buoyancy supported for strengthening and AROM exercises. Hydrostatic pressure also supports joints by unweighting joint load by at least 50 % in 3-4 feet depth water. 80% in chest to neck deep water. Water will provide assistance with movement using the current and laminar flow while the buoyancy reduces weight bearing. Pt requires the viscosity of the water for resistance with strengthening exercises.   [facility] Adult PT Treatment  05/24/2024:  Therapeutic Exercise:  nu-step - not tolerated d/t L leg and L UE pain Gripping putty in supine with hot pack applied  Manual: Not tolerated   Therapeutic Activity  Discussion on relaxation techniques including mindfulness and deep breathing      HOME EXERCISE PROGRAM:  [email or web address]  Treatment priorities   Eval        desensitization        sleep        Stress reduction        aquatics                  ASSESSMENT:  CLINICAL IMPRESSION: Overall Mikayla Livingston has had poor response to physical exercise.  We concentrated on gentle movement and desensitization initially but this only increased her pain.  She tried aquatics but this was agging as well.  She is now getting severe pain  in her L LE which is limiting her mobility.  I suggested she follow up with her PCP and/or ortho and pursue pain management or other avenues and then return to PT if needed as she is unable to meaningfully participate currently.  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  EVAL:  Brandey is a 43 y.o. female who presents to clinic with signs and sxs consistent with chronic neck and L>R UE and LE pain following a fall about 3 years ago.  The pain is variable but often debilitating.  She is no longer able to work or participate in recreation.  Given pain severity it is difficult to accurately assess any specific structure as all movements are provocative.  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   Provided desensitization program and will start with aquatic therapy for entry point into execise/movement.[name] will benefit from skilled PT to address relevant deficits and improve comfort with daily activities like reaching, lifting, and self care.   OBJECTIVE IMPAIRMENTS: Pain, shoulder ROM, cervical ROM, UE strength  ACTIVITY LIMITATIONS: lifting, carrying, recreation, work, self care, reaching, housework  PERSONAL FACTORS: See medical history and pertinent history   REHAB POTENTIAL: Good  CLINICAL DECISION MAKING: Evolving/moderate complexity  EVALUATION COMPLEXITY: Moderate   GOALS:   SHORT TERM GOALS: Target date: 06/06/2024  Jerzy will be >75% HEP compliant to improve carryover between sessions and facilitate independent management of condition  Evaluation: ongoing 10/16: not met d/t pain Goal status: NOT MET    LONG TERM GOALS: Target date: 07/04/2024   Oneida will self report >/=  50% decrease in pain from evaluation to improve function in daily tasks  Evaluation/Baseline: 10/10 max pain Goal status: NOT MET   2.  Os[place]ola will show a >/= 30 pct improvement in her QUICK DASH score (MCID is 10% or ~5 pts) as a proxy for functional improvement    Evaluation/Baseline: QuickDASH Score: 79.5 / 100 = 79.5 % 10/16: no change Goal status: NOT MET   3.  Japji will be able to take a shower, not limited by pain  Evaluation/Baseline: limited 10/16: severe pain Goal status: NOT MET   4.  Saranne will report confidence in self management of condition at time of discharge with advanced HEP  Evaluation/Baseline: unable to self manage 10/16: progressive severe pain Goal status: NOT MET   5.  Parrie will be able to participate in regular exercise as appropriate, not limited by pain  Evaluation/Baseline: limited 10/16: unable Goal status: NOT MET    PLAN: PT FREQUENCY: 1-2x/week  PT DURATION: 8 weeks  PLANNED INTERVENTIONS:  [id number]- PT Re-evaluation, 97110-Therapeutic exercises, 97530- Therapeutic activity, 97112- Neuromuscular re-education, 97535- Self Care, [id number]- Manual therapy, [id number]- Gait training, [id number]- Aquatic Therapy, [phone]- Electrical stimulation (manual), [id number]- Vasopneumatic device, [id number]- Traction (mechanical), [id number]- Ionotophoresis 4mg /ml Dexamethasone, Taping, Dry Needling, Joint manipulation, and Spinal manipulation.   Marlaysia Lenig E Dayshawn Irizarry PT  07/28/2024, 2:41 PM

## 2024-07-29 ENCOUNTER — Other Ambulatory Visit: Payer: Self-pay | Admitting: Physician Assistant

## 2024-07-29 MED ORDER — IBUPROFEN 800 MG PO TABS: 800.0000 mg | ORAL_TABLET | Freq: Three times a day (TID) | ORAL | 0 refills | Status: AC | PRN

## 2024-08-01 ENCOUNTER — Encounter: Payer: Self-pay | Admitting: Physician Assistant

## 2024-08-09 ENCOUNTER — Ambulatory Visit: Admitting: Physician Assistant

## 2024-08-15 ENCOUNTER — Encounter: Payer: Self-pay | Admitting: Radiology

## 2024-08-15 ENCOUNTER — Ambulatory Visit
Admission: RE | Admit: 2024-08-15 | Discharge: 2024-08-15 | Disposition: A | Source: Ambulatory Visit | Attending: Physician Assistant

## 2024-08-15 DIAGNOSIS — M542 Cervicalgia: Secondary | ICD-10-CM

## 2024-08-19 ENCOUNTER — Ambulatory Visit: Admitting: Physician Assistant

## 2024-08-19 ENCOUNTER — Encounter: Payer: Self-pay | Admitting: Radiology

## 2024-08-19 ENCOUNTER — Encounter: Payer: Self-pay | Admitting: Physician Assistant

## 2024-08-19 DIAGNOSIS — M542 Cervicalgia: Secondary | ICD-10-CM

## 2024-08-19 DIAGNOSIS — M25512 Pain in left shoulder: Secondary | ICD-10-CM | POA: Diagnosis not present

## 2024-08-19 DIAGNOSIS — G8929 Other chronic pain: Secondary | ICD-10-CM | POA: Diagnosis not present

## 2024-08-19 NOTE — Progress Notes (Signed)
 Office Visit Note   Patient: Mikayla Livingston           Date of Birth: [birth year]           MRN: [id number] Visit Date: 08/19/2024              Requested by: Tanda Bleacher, MD [address] [place],  [place] [address] PCP: Tanda Bleacher, MD  Chief Complaint  Patient presents with   Neck - Follow-up    MRI REVIEW      HPI: Patient is a very pleasant 43 year old woman who comes in today for MRI review of her cervical spine.  She has a history of a fall at work 3 years ago.  Since then she has had pain in her neck with symptoms radiating down her left arm with associated paresthesias.  I did try physical therapy with her.  She was compliant with this however it only made her symptoms worse.  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  Assessment & Plan: Visit Diagnoses:  1. Cervicalgia   2. Neck pain on left side   3. Chronic left shoulder pain     Plan: I had a long discussion we reviewed the MRI today she does have an incidental finding of a nodule on her thyroid she is seeing an endocrinologist in January I sent a copy of the MRI both to her PCP as well as her endocrinologist.  With regards to her neck her MRI was reviewed.  There was no specific findings consistent with the problem she is having on the left.  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  I am not significantly familiar with this finding.  Given her lack of improvement in 3 years of this getting worse I would like for her to actually review the MRI with Dr. Georgina have placed a referral  Follow-Up Instructions: Dr. Georgina Beers Exam  Patient is alert, oriented, no adenopathy, well-dressed, normal affect, normal respiratory effort. Exam unchanged she has pain that radiates from her neck down into her fingers on the left with associated  paresthesias strength is a bit limited just by pain.  Has stiffness with flexion and extension of her neck    Imaging: No results found. No images are attached to the encounter.  Labs: Lab Results  Component Value Date   HGBA1C 5.6 03/10/2024     Lab Results  Component Value Date   ALBUMIN 4.3 03/10/2024    No results found for: MG No results found for: VD25OH  No results found for: PREALBUMIN    Latest Ref Rng & Units 03/10/2024    2:59 PM 04/11/2018    9:31 AM  CBC EXTENDED  WBC 3.4 - 10.8 x10E3/uL 10.2  6.8   RBC 3.77 - 5.28 x10E6/uL 5.37  4.90   Hemoglobin 11.1 - 15.9 g/dL 86.7  88.4   HCT 65.9 - 46.6 % 42.5  36.9   Platelets 150 - 450 x10E3/uL 333  272   NEUT# 1.4 - 7.0 x10E3/uL 7.4    Lymph# 0.7 - 3.1 x10E3/uL 1.9       There is no height or weight on file to calculate BMI.  Orders:  Orders Placed This Encounter  Procedures   Ambulatory referral to Orthopedic Surgery   No orders of the defined types were  placed in this encounter.    Procedures: No procedures performed  Clinical Data: No additional findings.  ROS:  All other systems negative, except as noted in the HPI. Review of Systems  Objective: Vital Signs: There were no vitals taken for this visit.  Specialty Comments:  No specialty comments available.  PMFS History: There are no active problems to display for this patient.  Past Medical History:  Diagnosis Date   Asthma     No family history on file.  No past surgical history on file. Social History   Occupational History   Not on file  Tobacco Use   Smoking status: Every Day    Types: Cigarettes   Smokeless tobacco: Never  Substance and Sexual Activity   Alcohol use: No   Drug use: No   Sexual activity: Never

## 2024-08-24 ENCOUNTER — Other Ambulatory Visit: Payer: Self-pay | Admitting: Family Medicine

## 2024-08-24 ENCOUNTER — Ambulatory Visit: Payer: Self-pay | Admitting: Family Medicine

## 2024-08-24 DIAGNOSIS — E041 Nontoxic single thyroid nodule: Secondary | ICD-10-CM

## 2024-09-01 ENCOUNTER — Ambulatory Visit (HOSPITAL_COMMUNITY)
Admission: RE | Admit: 2024-09-01 | Discharge: 2024-09-01 | Disposition: A | Discharge status: Home / Self Care | Source: Ambulatory Visit | Attending: Family Medicine | Admitting: Family Medicine

## 2024-09-01 DIAGNOSIS — E041 Nontoxic single thyroid nodule: Secondary | ICD-10-CM | POA: Insufficient documentation

## 2024-09-02 ENCOUNTER — Ambulatory Visit: Payer: Self-pay | Admitting: Family Medicine

## 2024-09-02 DIAGNOSIS — E041 Nontoxic single thyroid nodule: Secondary | ICD-10-CM

## 2024-09-02 NOTE — Progress Notes (Signed)
 Patient reviewed lab results and provider recommendations via MyChart

## 2024-09-04 ENCOUNTER — Other Ambulatory Visit: Payer: Self-pay | Admitting: Physician Assistant

## 2024-09-28 ENCOUNTER — Encounter: Admitting: Orthopedic Surgery

## 2024-10-08 NOTE — ED Provider Notes (Signed)
 " [facility]  ED Provider Note  Daniya Aramburo Schliep 43 y.o. female DOB: Apr 11, 1981 MRN: [id number] History   Chief Complaint  Patient presents with   Nasal Congestion    Nasal congestion, shob for 2 weeks.    43 year old female presenting to the emergency department with her significant other.  States she has been experiencing nasal congestion, runny nose, cough, shortness of breath for the past 2 weeks.  States she has been exposed to black mold in the bathroom of her home.  She thinks this may have caused her symptoms.  Denies any specific ill contacts.  Denies history of asthma but does report history of bronchitis in the past.  Denies smoking.  States she used to smoke Black and Milds but quit about 4 months ago.  Past history in EMR does include treatment for allergies including a prescription for an EpiPen.  She denies any signs or symptoms of anaphylaxis currently.  States the home she is currently in is a house that is rented.  She is trying to work with the landlord to get the mold removed.  She also reports looking to try and change her living situation to get away from it.  She reports fatigue and malaise as well.  Denies any other associated symptoms, problems, concerns.  ROS: See HPI.  General: Ill appearing. Nontoxic. No acute distress. Head & Face: Normocephalic. Atraumatic. Eyes: PERRL. EOMI. Conjunctiva clear.  ENT: Coryza without rhinorrhea.  Ears unremarkable bilaterally.  Posterior pharynx with minimal erythema.  No other gross abnormalities. Neck: Trachea midline. Normal ROM. No meningeal signs. Cardiovascular: RRR. Normal perfusion. Respiratory: Coarse expiratory wheeze to left lower lobe.  No other adventitious sounds.  Normal effort.  Frequent nonproductive cough. Chest: Normal expansion and excursion. Nontender.  Abdomen: Protuberant, soft, nontender, nondistended. No rebound or guarding. No CVAT. Skin:  Normal for ethnicity. MS/EXT: No  gross abnormalities.  Neuro: A&Ox4. Moving all 4 extremities. Psych: Calm, cooperative, interacts appropriately.  Past Medical History:  Diagnosis Date   Herniated disc     History reviewed. No pertinent surgical history.  Social History   Substance and Sexual Activity  Alcohol Use Yes   Tobacco Use History[1] E-Cigarettes   Vaping Use     Start Date     Cartridges/Day     Quit Date     Social History   Substance and Sexual Activity  Drug Use No         Allergies[2]  Home Medications   EPINEPHRINE (EPIPEN 2-PAK) 0.3 MG/0.3 ML SOAJ    Inject 0.3 mLs (0.3 mg total) into the skin as needed for Anaphylaxis. See package instructions   IBUPROFEN  (ADVIL ,MOTRIN ) 800 MG TABLET    Take one tablet (800 mg dose) by mouth 3 (three) times a day.   METHOCARBAMOL (ROBAXIN) 500 MG TABLET    Take one tablet (500 mg dose) by mouth 2 (two) times daily for 10 days.      ED Triage Vitals [10/08/24 1138]  BP (!) 115/53  Heart Rate 63  Resp 19  SpO2 99 %  Temp 98.3 F (36.8 C)      ED Course   Lab results:   CBC AND DIFFERENTIAL - Abnormal      Result Value   WBC 6.5     RBC 4.99     HGB 11.9     HCT 36.5     MCV 73.1 (*)    MCH 23.8 (*)    MCHC 32.6  Plt Ct 310     RDW SD 37.9     MPV 10.8     NRBC% 0.0     Absolute NRBC Count 0.00     NEUTROPHIL % 49.2     LYMPHOCYTE % 33.4     MONOCYTE % 11.3     Eosinophil % 5.5     BASOPHIL % 0.3     IG% 0.3     ABSOLUTE NEUTROPHIL COUNT 3.20     ABSOLUTE LYMPHOCYTE COUNT 2.18     Absolute Monocyte Count 0.74     Absolute Eosinophil Count 0.36     Absolute Basophil Count 0.02     Absolute Immature Granulocyte Count 0.02    COMPREHENSIVE METABOLIC PANEL - Abnormal   Na 140     Potassium 3.5 (*)    Cl 104     CO2 22     AGAP 14     Glucose 86     BUN 9     Creatinine 0.76     Ca 9.3     ALK PHOS 51     T Bili 0.2     Total Protein 7.2     Alb 4.1     GLOBULIN 3.1     ALBUMIN/GLOBULIN RATIO 1.3      BUN/CREAT RATIO 11.8     ALT 20     AST 21     eGFR 101     Comment: Normal GFR (glomerular filtration rate) > 60 mL/min/1.73 meters squared, < 60 may include impaired kidney function. Calculation based on the Chronic Kidney Disease Epidemiology Collaboration (CK-EPI)equation refit without adjustment for race.  COVID-19, FLU A+B AND RSV - Normal   Flu A Negative     Flu B Negative     RSV PCR Negative     SARS-COV-2 Not Detected     Narrative:    SARS-COV-2 (COVID-19)PCR-Negative results do not preclude SARS-CoV-2 infection and should not be used as the sole basis for patient management decisions. Negative results must be combined with clinical observations, patient history, and epidemiological information.  Flu and/or RSV - Negative results do not preclude the presence of Flu or RSV virus and should not be used as the sole basis for treatment or other patient management decisions. False negative results may occur if virus is present at levels below the analytical limit of detection.  This test detects Influenza A, Influenza B, and Respiratory Syncytial Virus and SARS-COV-2 (COVID-19) by PCR.     NT-PROBNP - Normal   NT-ProBNP 169     Comment: Among patients presenting with dyspnea, NT-proBNP is highly sensitive for the detection of acute decompensated heart failure (ADHF). In addition, a NT-proBNP when used with the recommended age-independent exclusionary cut-point of <300 pg/mL effectively rules out ADHF with a negative predictive value (NPV) ranging from 97.7% for males to 98.5% for females.  HUMAN CHORIONIC GONADOTROPIN  (HCG), BETA-SUBUNIT, QUALITATIVE - Normal   Beta HCG Qual Negative      Imaging:   XR CHEST AP PORTABLE   Narrative:    SINGLE VIEW AP CHEST  INDICATION:Shortness of breath  COMPARISON: 10/04/2024.  FINDINGS:  The lungs are clear. The cardiomediastinal silhouette is normal in size and contour.     Impression:    IMPRESSION:  No acute cardiopulmonary  disease.  Electronically Signed by: Sonny Appl, MD on 10/08/2024 1:52 PM     ECG: ECG Results   None  Pre-Sedation Procedures    Medical Decision Making Amount and/or Complexity of Data Reviewed Labs: ordered. Radiology: ordered. ECG/medicine tests: ordered.  Risk OTC drugs. Prescription drug management.  Serum labs essentially unremarkable.  Testing for influenza A, influenza B, RSV, and COVID-19 all negative.  Favoring allergic process at this time.  She could have overlapping viral process as well.  Prescriptions provided for symptomatic management.  Advised her to follow-up with her primary care as soon as possible and to try to move out of her rental home as soon as possible, have the landlord address it as soon as possible, and try to avoid contact with the mold as much as possible.  She does appear to be acutely ill but grossly stable at this time.  Low estimation for any red flags.  Discussed diagnoses, diagnostic results, discharge instructions and plan of care, and emergent return precautions.  She and her significant other verbalized understanding and agreement.     New Prescriptions   ALBUTEROL  SULFATE HFA (PROVENTIL ,VENTOLIN ,PROAIR ) 108 (90 BASE) MCG/ACT INHALER    Inhale two puffs into the lungs every 4 (four) hours as needed for Wheezing.      Quantity: 18 g    Refills: 0   BENZONATATE (TESSALON) 100 MG CAPSULE    Take one capsule (100 mg dose) by mouth every 8 (eight) hours for 7 days.      Quantity: 21 capsule    Refills: 0   FEXOFENADINE (ALLEGRA) 180 MG TABLET    Take one tablet (180 mg dose) by mouth daily.      Quantity: 30 tablet    Refills: 0   IPRATROPIUM (ATROVENT ) 0.03% NASAL SPRAY    two sprays by Both Nostrils route every 12 (twelve) hours for 10 days.      Quantity: 3.5 mL    Refills: 0   MOMETASONE (NASONEX 24HR) 50 MCG/ACT NASAL SPRAY     two sprays by Both Nostrils route daily.      Quantity: 17 g    Refills: 0   PREDNISONE  (DELTASONE ) 20 MG TABLET    Take two tablets (40 mg dose) by mouth daily for 5 days.      Quantity: 10 tablet    Refills: 0    Modified Medications   No medications on file    Discontinued Medications   No medications on file    Clinical Impression Final diagnoses:  Acute bronchitis, unspecified organism  Acute rhinitis  Exposure to mold    ED Disposition     ED Disposition  Discharge   Condition  Stable   Comment  --                 Follow-up Information     Schedule an appointment as soon as possible for a visit  with Raguel SHAUNNA Blush, MD.   Specialty: Family Medicine Contact information: [address] [place] [address] [phone]                  Electronically signed by:       [1] Social History Tobacco Use  Smoking Status Never  Smokeless Tobacco Not on file  [2] Allergies Allergen Reactions   Oxycodone Itching   Penicillins Hives   Robert A Majors, FNP 10/08/24 1422  "

## 2024-10-10 ENCOUNTER — Ambulatory Visit (INDEPENDENT_AMBULATORY_CARE_PROVIDER_SITE_OTHER): Admitting: Orthopedic Surgery

## 2024-10-10 VITALS — BP 113/75 | HR 76 | Ht 64.5 in | Wt 155.2 lb

## 2024-10-10 DIAGNOSIS — G950 Syringomyelia and syringobulbia: Secondary | ICD-10-CM

## 2024-10-10 MED ORDER — PREGABALIN 75 MG PO CAPS: 75.0000 mg | ORAL_CAPSULE | Freq: Two times a day (BID) | ORAL | 1 refills | Status: AC

## 2024-10-10 NOTE — Progress Notes (Signed)
 Orthopedic Spine Surgery Office Note  Assessment: Patient is a 43 y.o. female with left trapezius pain, left lateral arm, left dorsal forearm, left hand pain.  No significant stenosis seen in the cervical spine to explain her symptoms   Plan: -I do not see any significant stenosis in her cervical spine and neither did the radiologist so I do not feel that her left arm pain is consistent with cervical radiculopathy. Told her she can follow up with the referring provider for further work up -Prescribed Lyrica  for pain relief -Recommended MRI without and without contrast of the thoracic spine to evaluate the syrinx further -Would need to be nicotine free prior to any elective spine surgery -Will call the patient with the results of the thoracic MRI   Patient expressed understanding of the plan and all questions were answered to the patient's satisfaction.   ___________________________________________________________________________   History:  Patient is a 43 y.o. female who presents today for cervical spine.  Patient has had 3 years of pain that goes into her left trapezius, left shoulder, left lateral arm, left dorsal forearm, left hand.  She notes the pain on a daily basis.  She has had trouble working as a result of the pain.  She has felt weakness in that hand and arm.  She has been worked up with an outside orthopedic group but has transferred her care here.  She has no pain radiating into the right upper extremity.  Patient's weakness has been present for 3 years now.  Her pain has gotten worse over time.  Has noticed weakness in the left upper extremity.  No other weakness noted.  Has had difficulty at times using the left hand.  No difficulty with the right upper extremity.  No bowel or bladder incontinence.  No saddle anesthesia.  Treatments tried: PT, Tylenol, meloxicam , ibuprofen , methocarbamol, gabapentin  Review of systems: Denies fevers and chills, night sweats, unexplained  weight loss, history of cancer, pain that wakes them at night  Past medical history: Asthma  Allergies: oxycodone, penicillin  Past surgical history:  None  Social history: Reports use of nicotine product (smoking, vaping, patches, smokeless) Alcohol use: Yes, approximately 2 drinks per week Denies recreational drug use   Physical Exam:  BMI of 26.2  General: no acute distress, appears stated age Neurologic: alert, answering questions appropriately, following commands Respiratory: unlabored breathing on room air, symmetric chest rise Psychiatric: appropriate affect, normal cadence to speech   MSK (spine):  -Strength exam      Left  Right Grip strength                4/5  4/5 Interosseus   4/5   5/5 Wrist extension  4/5  5/5 Wrist flexion   4/5  5/5 Elbow flexion   3/5  5/5 Deltoid    3/5  5/5      Left  Right EHL    5/5  5/5 TA    5/5  5/5 GSC    5/5  5/5 Knee extension  5/5  5/5 Hip flexion   5/5  5/5  Patient with cogwheel weakness.  Increased pain with wrist extension and biceps testing  -Sensory exam    Sensation intact to light touch in C5-T1 nerve distributions of bilateral upper extremities  Sensation intact light touch in L3-S1 nerve distributions of bilateral lower extremities  -Brachioradialis DTR: 2/4 on the left, 2/4 on the right -Biceps DTR: 2/4 on the left, 2/4 on the right -Patellar tendon DTR:  2/4 on the left, 2/4 in the right -Achilles tendon DTR: 2/4 in the left, 2/4 the right  -Spurling: Negative bilaterally -Hoffman sign: Negative bilaterally -Clonus: No beats bilaterally -Interosseous wasting: None seen -Grip and release test: Negative -Gait: Normal  Imaging: XRs of the cervical spine from 03/17/2024 were independently reviewed and interpreted, showing disc height loss at C5/6. No other significant degenerative changes. No spondylolisthesis seen. No fracture or dislocation seen.   MRI of the cervical spine from 08/15/2024 was  independently reviewed and interpreted, showing no significant central or foraminal stenosis. No T2 cord signal change within the cervical spine. 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 The cord dose not appear to be expanded.    Patient name: Mikayla Livingston Patient MRN: [id number] Date of visit: 10/10/2024

## 2024-10-19 ENCOUNTER — Ambulatory Visit (INDEPENDENT_AMBULATORY_CARE_PROVIDER_SITE_OTHER): Admitting: "Endocrinology

## 2024-10-19 ENCOUNTER — Encounter: Payer: Self-pay | Admitting: "Endocrinology

## 2024-10-19 VITALS — BP 110/80 | Ht 64.5 in | Wt 153.0 lb

## 2024-10-19 DIAGNOSIS — E0590 Thyrotoxicosis, unspecified without thyrotoxic crisis or storm: Secondary | ICD-10-CM

## 2024-10-19 DIAGNOSIS — E041 Nontoxic single thyroid nodule: Secondary | ICD-10-CM | POA: Diagnosis not present

## 2024-10-19 NOTE — Progress Notes (Signed)
 "   Outpatient Endocrinology Note Mikayla Birmingham, MD  10/19/2024  Mikayla Livingston [birth year] [id number]  Referring Provider: Tanda Bleacher, MD Primary Care Provider: Tanda Bleacher, MD Subjective  No chief complaint on file.  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 Patient is currently biochemically subclinically hyperthyroid. Instructed patient not to take levothyroxine  (she never did anyway). Educated on thyroid  axis.  Recommend the following: ordered repeat labs.  Repeat lab before next visit or sooner if symptoms of hyperthyroidism or hypothyroidism develop.  Notify us  immediately in case of pregnancy/breastfeeding or significant weight gain or loss. Counseled on compliance and follow up needs.  08/2024 THYROID  ULTRASOUND images and report reviewed: Nodule # 1: Left; Mid 1.9 cm x 1.0 x 1.1 cm TR4.  Patient did not schedule FNA ordered by her provider.  Ordered FNA and explained to patient the indication for FNA.   I have reviewed current medications, nurse's notes, allergies, vital signs, past medical and surgical history, family medical history, and social history for this encounter. Counseled patient on symptoms, examination findings, lab findings, imaging results, treatment decisions and monitoring and prognosis. The patient understood the recommendations and agrees with the treatment plan. All questions regarding treatment plan were fully answered.   Return in about 30 days (around 11/18/2024).   Mikayla Birmingham, MD  10/19/2024   I have reviewed current medications, nurse's notes, allergies, vital signs, past medical and surgical history, family medical history, and social history for this encounter. Counseled patient  on symptoms, examination findings, lab findings, imaging results, treatment decisions and monitoring and prognosis. The patient understood the recommendations and agrees with the treatment plan. All questions regarding treatment plan were fully answered.   History of Present Illness Mikayla Livingston is a 44 y.o. year old female who presents to our clinic with subclinical hyperthyroidism diagnosed in 02/2024.    Symptoms suggestive of HYPOTHYROIDISM:  fatigue Yes weight gain No cold intolerance  No constipation  No  Symptoms suggestive of HYPERTHYROIDISM:  weight loss  No heat intolerance Yes hyperdefecation  No palpitations  No  Compressive symptoms:  dysphagia  No, has pain sometimes with swallowing  dysphonia  No positional dyspnea (especially with simultaneous arms elevation)  No  Smokes  No On biotin  No, with MVIo Personal history of head/neck surgery/irradiation  No  Physical Exam  BP 110/80   Ht 5' 4.5 (1.638 m)   Wt 153 lb (69.4 kg)   BMI 25.86 kg/m  Constitutional: well developed, well nourished Head: normocephalic, atraumatic, no exophthalmos Eyes: sclera anicteric, no redness Neck: no thyromegaly, no thyroid  tenderness; no nodules palpated Lungs: normal respiratory effort Neurology: alert and oriented, no fine hand tremor Skin: dry, no appreciable rashes Musculoskeletal: no appreciable defects Psychiatric: normal mood and affect  Allergies Allergies[1]  Current Medications Patient's Medications  New Prescriptions   No medications on file  Previous Medications   IBUPROFEN  (ADVIL ) 800 MG TABLET    Take 1 tablet (800 mg total) by mouth every 8 (eight) hours as needed.   LEVOTHYROXINE  (SYNTHROID ) 25 MCG TABLET    Take 1 tablet (25 mcg total) by mouth daily.   MELOXICAM  (MOBIC ) 15 MG TABLET  Take 1 tablet (15 mg total) by mouth daily.   PREGABALIN  (LYRICA ) 75 MG CAPSULE    Take 1 capsule (75 mg total) by mouth 2 (two) times daily.  Modified Medications   No  medications on file  Discontinued Medications   No medications on file    Past Medical History Past Medical History:  Diagnosis Date   Asthma     Past Surgical History History reviewed. No pertinent surgical history.  Family History family history is not on file.  Social History Social History   Socioeconomic History   Marital status: Single    Spouse name: Not on file   Number of children: Not on file   Years of education: Not on file   Highest education level: Not on file  Occupational History   Not on file  Tobacco Use   Smoking status: Every Day    Types: Cigarettes   Smokeless tobacco: Never  Substance and Sexual Activity   Alcohol use: No   Drug use: No   Sexual activity: Never  Other Topics Concern   Not on file  Social History Narrative   Not on file   Social Drivers of Health   Tobacco Use: High Risk (10/19/2024)   Patient History    Smoking Tobacco Use: Every Day    Smokeless Tobacco Use: Never    Passive Exposure: Not on file  Financial Resource Strain: Low Risk (03/10/2024)   Overall Financial Resource Strain (CARDIA)    Difficulty of Paying Living Expenses: Not very hard  Food Insecurity: No Food Insecurity (03/10/2024)   Hunger Vital Sign    Worried About Running Out of Food in the Last Year: Never true    Ran Out of Food in the Last Year: Never true  Transportation Needs: No Transportation Needs (03/10/2024)   PRAPARE - Administrator, Civil Service (Medical): No    Lack of Transportation (Non-Medical): No  Physical Activity: Inactive (03/10/2024)   Exercise Vital Sign    Days of Exercise per Week: 0 days    Minutes of Exercise per Session: 0 min  Stress: No Stress Concern Present (03/10/2024)   [employer] of Occupational Health - Occupational Stress Questionnaire    Feeling of Stress : Not at all  Social Connections: Moderately Isolated (03/10/2024)   Social Connection and Isolation Panel    Frequency of Communication with  Friends and Family: Three times a week    Frequency of Social Gatherings with Friends and Family: Three times a week    Attends Religious Services: 1 to 4 times per year    Active Member of Clubs or Organizations: No    Attends Banker Meetings: Never    Marital Status: Never married  Intimate Partner Violence: Not At Risk (10/08/2024)   Received from [facility]   HITS    Over the last 12 months how often did your partner physically hurt you?: Never    Over the last 12 months how often did your partner insult you or talk down to you?: Never    Over the last 12 months how often did your partner threaten you with physical harm?: Never    Over the last 12 months how often did your partner scream or curse at you?: Never  Depression (PHQ2-9): Low Risk (06/30/2024)   Depression (PHQ2-9)    PHQ-2 Score: 0  Alcohol Screen: Low Risk (03/10/2024)   Alcohol Screen    Last Alcohol Screening Score (AUDIT): 0  Housing:  Low Risk (03/10/2024)   Housing Stability Vital Sign    Unable to Pay for Housing in the Last Year: No    Number of Times Moved in the Last Year: 0    Homeless in the Last Year: No  Utilities: Not At Risk (03/10/2024)   AHC Utilities    Threatened with loss of utilities: No  Health Literacy: Adequate Health Literacy (03/10/2024)   B1300 Health Literacy    Frequency of need for help with medical instructions: Never    Laboratory Investigations Lab Results  Component Value Date   TSH 0.274 (L) 06/23/2024   TSH 0.280 (L) 03/10/2024   FREET4 1.30 06/23/2024     No results found for: TSI   No components found for: TRAB   Lab Results  Component Value Date   CHOL 154 03/10/2024   Lab Results  Component Value Date   HDL 39 (L) 03/10/2024   Lab Results  Component Value Date   LDLCALC 81 03/10/2024   Lab Results  Component Value Date   TRIG 201 (H) 03/10/2024   Lab Results  Component Value Date   CHOLHDL 3.9 03/10/2024   Lab Results  Component Value  Date   CREATININE 0.78 03/10/2024   No results found for: GFR    Component Value Date/Time   NA 138 03/10/2024 1459   K 4.4 03/10/2024 1459   CL 103 03/10/2024 1459   CO2 20 03/10/2024 1459   GLUCOSE 93 03/10/2024 1459   GLUCOSE 91 04/11/2018 0931   BUN 14 03/10/2024 1459   CREATININE 0.78 03/10/2024 1459   CALCIUM 9.3 03/10/2024 1459   PROT 6.9 03/10/2024 1459   ALBUMIN 4.3 03/10/2024 1459   AST 18 03/10/2024 1459   ALT 15 03/10/2024 1459   ALKPHOS 57 03/10/2024 1459   BILITOT <0.2 03/10/2024 1459   GFRNONAA >60 04/11/2018 0931   GFRAA >60 04/11/2018 0931      Latest Ref Rng & Units 03/10/2024    2:59 PM 04/11/2018    9:31 AM  BMP  Glucose 70 - 99 mg/dL 93  91   BUN 6 - 24 mg/dL 14  7   Creatinine 9.42 - 1.00 mg/dL 9.21  9.21   BUN/Creat Ratio 9 - 23 18    Sodium 134 - 144 mmol/L 138  137   Potassium 3.5 - 5.2 mmol/L 4.4  3.9   Chloride 96 - 106 mmol/L 103  106   CO2 20 - 29 mmol/L 20  25   Calcium 8.7 - 10.2 mg/dL 9.3  8.7        Component Value Date/Time   WBC 10.2 03/10/2024 1459   WBC 6.8 04/11/2018 0931   RBC 5.37 (H) 03/10/2024 1459   RBC 4.90 04/11/2018 0931   HGB 13.2 03/10/2024 1459   HCT 42.5 03/10/2024 1459   PLT 333 03/10/2024 1459   MCV 79 03/10/2024 1459   MCH 24.6 (L) 03/10/2024 1459   MCH 23.5 (L) 04/11/2018 0931   MCHC 31.1 (L) 03/10/2024 1459   MCHC 31.2 04/11/2018 0931   RDW 14.2 03/10/2024 1459   LYMPHSABS 1.9 03/10/2024 1459   EOSABS 0.2 03/10/2024 1459   BASOSABS 0.0 03/10/2024 1459      Parts of this note may have been dictated using voice recognition software. There may be variances in spelling and vocabulary which are unintentional. Not all errors are proofread. Please notify the dino if any discrepancies are noted or if the meaning of any statement is not clear.       [  1]  Allergies Allergen Reactions   Oxycodone Itching and Rash   Penicillins Hives    Has patient had a PCN reaction causing immediate rash,  facial/tongue/throat swelling, SOB or lightheadedness with hypotension: Yes Has patient had a PCN reaction causing severe rash involving mucus membranes or skin necrosis: No Has patient had a PCN reaction that required hospitalization No Has patient had a PCN reaction occurring within the last 10 years: Yes If all of the above answers are NO, then may proceed with Cephalosporin use.    Shellfish Allergy    "

## 2024-10-21 ENCOUNTER — Ambulatory Visit: Admitting: Physician Assistant

## 2024-10-21 DIAGNOSIS — G5793 Unspecified mononeuropathy of bilateral lower limbs: Secondary | ICD-10-CM | POA: Diagnosis not present

## 2024-10-21 DIAGNOSIS — M79672 Pain in left foot: Secondary | ICD-10-CM | POA: Diagnosis not present

## 2024-10-21 NOTE — Progress Notes (Signed)
 "  Office Visit Note   Patient: Mikayla Livingston           Date of Birth: Apr 28, 1981           MRN: [id number] Visit Date: 10/21/2024              Requested by: Tuyet Bader, Ronal Dragon, PA [address] [place],  [place] [address] PCP: Tanda Bleacher, MD  Chief Complaint  Patient presents with   Left Foot - Pain      HPI: Patient presents today for evaluation of left greater than right foot burning and tingling.  She said this has been going on for a while.  It seems to be getting worse.  She said the left foot particularly is bad.  She said that she feels like something is squeezing her feet.  Its gotten bad enough that she cannot tolerate regular shoewear.  She does not have a history of diabetes.  She has been seen by Dr. Georgina.  She originally started on gabapentin but he switched her to Lyrica  which she has not started yet because she has been on other medications for bronchitis.  Assessment & Plan: Visit Diagnoses:  1. Pain in left foot   2. Neuropathy of both feet     Plan: History of left greater than right foot burning.  I would like her to get nerve studies on her lower extremities.  If there is a neuropathy hopefully the Lyrica  will work better for her.  Could also consider referring her to Dr. Burnetta  Follow-Up Instructions: Return if symptoms worsen or fail to improve.   Ortho Exam  Patient is alert, oriented, no adenopathy, well-dressed, normal affect, normal respiratory effort. Examination of her feet she has pulses but strong pulses bilaterally no redness no erythema she does have pain even with light touching of her left foot greater than her right foot.  She has good strength with dorsiflexion plantarflexion eversion inversion pulses are intact bilaterally    Imaging: No results found. No images are attached to the encounter.  Labs: Lab Results  Component Value Date   HGBA1C 5.6 03/10/2024     Lab Results  Component Value Date   ALBUMIN 4.3 03/10/2024    No  results found for: MG No results found for: VD25OH  No results found for: PREALBUMIN    Latest Ref Rng & Units 03/10/2024    2:59 PM 04/11/2018    9:31 AM  CBC EXTENDED  WBC 3.4 - 10.8 x10E3/uL 10.2  6.8   RBC 3.77 - 5.28 x10E6/uL 5.37  4.90   Hemoglobin 11.1 - 15.9 g/dL 86.7  88.4   HCT 65.9 - 46.6 % 42.5  36.9   Platelets 150 - 450 x10E3/uL 333  272   NEUT# 1.4 - 7.0 x10E3/uL 7.4    Lymph# 0.7 - 3.1 x10E3/uL 1.9       There is no height or weight on file to calculate BMI.  Orders:  Orders Placed This Encounter  Procedures   Ambulatory referral to Physical Medicine Rehab   No orders of the defined types were placed in this encounter.    Procedures: No procedures performed  Clinical Data: No additional findings.  ROS:  All other systems negative, except as noted in the HPI. Review of Systems  Objective: Vital Signs: There were no vitals taken for this visit.  Specialty Comments:  No specialty comments available.  PMFS History: Patient Active Problem List   Diagnosis Date Noted   Neuropathy  of both feet 10/21/2024   Past Medical History:  Diagnosis Date   Asthma     No family history on file.  No past surgical history on file. Social History   Occupational History   Not on file  Tobacco Use   Smoking status: Every Day    Types: Cigarettes   Smokeless tobacco: Never  Substance and Sexual Activity   Alcohol use: No   Drug use: No   Sexual activity: Never       "

## 2024-10-25 ENCOUNTER — Encounter: Payer: Self-pay | Admitting: Orthopedic Surgery

## 2024-10-28 ENCOUNTER — Other Ambulatory Visit

## 2024-11-08 ENCOUNTER — Other Ambulatory Visit

## 2024-11-14 ENCOUNTER — Ambulatory Visit (HOSPITAL_COMMUNITY): Admission: RE | Admit: 2024-11-14 | Source: Ambulatory Visit

## 2024-11-18 ENCOUNTER — Telehealth: Admitting: "Endocrinology

## 2024-12-01 ENCOUNTER — Other Ambulatory Visit

## 2024-12-20 ENCOUNTER — Other Ambulatory Visit (HOSPITAL_COMMUNITY)

## 2024-12-30 ENCOUNTER — Telehealth: Admitting: "Endocrinology

## 2025-03-21 ENCOUNTER — Ambulatory Visit: Admitting: Neurology
